# Patient Record
Sex: Female | Born: 1980
Health system: Southern US, Community
[De-identification: ages and names within clinical notes are randomized; demographics above are authoritative.]

## PROBLEM LIST (undated history)

## (undated) DIAGNOSIS — T4145XA Adverse effect of unspecified anesthetic, initial encounter: Secondary | ICD-10-CM

## (undated) DIAGNOSIS — I10 Essential (primary) hypertension: Secondary | ICD-10-CM

## (undated) DIAGNOSIS — I471 Supraventricular tachycardia, unspecified: Secondary | ICD-10-CM

## (undated) DIAGNOSIS — D649 Anemia, unspecified: Secondary | ICD-10-CM

## (undated) DIAGNOSIS — IMO0002 Reserved for concepts with insufficient information to code with codable children: Secondary | ICD-10-CM

## (undated) DIAGNOSIS — G43909 Migraine, unspecified, not intractable, without status migrainosus: Secondary | ICD-10-CM

## (undated) DIAGNOSIS — A749 Chlamydial infection, unspecified: Secondary | ICD-10-CM

## (undated) DIAGNOSIS — T8859XA Other complications of anesthesia, initial encounter: Secondary | ICD-10-CM

## (undated) DIAGNOSIS — R000 Tachycardia, unspecified: Secondary | ICD-10-CM

## (undated) DIAGNOSIS — Z9889 Other specified postprocedural states: Secondary | ICD-10-CM

## (undated) DIAGNOSIS — Z973 Presence of spectacles and contact lenses: Secondary | ICD-10-CM

## (undated) DIAGNOSIS — R55 Syncope and collapse: Secondary | ICD-10-CM

## (undated) DIAGNOSIS — Z8619 Personal history of other infectious and parasitic diseases: Secondary | ICD-10-CM

## (undated) DIAGNOSIS — R87629 Unspecified abnormal cytological findings in specimens from vagina: Secondary | ICD-10-CM

## (undated) DIAGNOSIS — R112 Nausea with vomiting, unspecified: Secondary | ICD-10-CM

## (undated) DIAGNOSIS — Z8742 Personal history of other diseases of the female genital tract: Secondary | ICD-10-CM

## (undated) DIAGNOSIS — D696 Thrombocytopenia, unspecified: Secondary | ICD-10-CM

## (undated) HISTORY — DX: Syncope and collapse: R55

## (undated) HISTORY — PX: HERNIA REPAIR: SHX51

## (undated) HISTORY — DX: Unspecified abnormal cytological findings in specimens from vagina: R87.629

## (undated) HISTORY — PX: COLPOSCOPY: SHX161

## (undated) HISTORY — DX: Thrombocytopenia, unspecified: D69.6

## (undated) HISTORY — DX: Nausea with vomiting, unspecified: Z98.890

## (undated) HISTORY — DX: Nausea with vomiting, unspecified: R11.2

---

## 2005-08-25 HISTORY — PX: DILATION AND CURETTAGE OF UTERUS: SHX78

## 2012-03-09 LAB — OB RESULTS CONSOLE ABO/RH: RH Type: POSITIVE

## 2012-03-09 LAB — OB RESULTS CONSOLE ANTIBODY SCREEN: Antibody Screen: NEGATIVE

## 2012-03-09 LAB — OB RESULTS CONSOLE RPR: RPR: NONREACTIVE

## 2012-03-09 LAB — OB RESULTS CONSOLE HEPATITIS B SURFACE ANTIGEN: Hepatitis B Surface Ag: NEGATIVE

## 2012-08-24 LAB — OB RESULTS CONSOLE GBS: GBS: POSITIVE

## 2012-08-25 NOTE — L&D Delivery Note (Signed)
Delivery Note At 7:55 AM a viable and healthy female was delivered via Vaginal, Spontaneous Delivery (Presentation: Left; Occiput Anterior).  APGAR: , ; weight .   Placenta status: Intact, Spontaneous.  Cord: 3 vessels   Anesthesia: Epidural  Episiotomy: None Lacerations: Bilateral labial lacerations, no repair required Suture Repair: None Est. Blood Loss (mL): 250cc  Mom to postpartum.  Baby to nursery-stable.  Matti Minney H. 09/05/2012, 8:12 AM

## 2012-09-02 ENCOUNTER — Encounter (HOSPITAL_COMMUNITY): Payer: Self-pay | Admitting: *Deleted

## 2012-09-02 ENCOUNTER — Inpatient Hospital Stay (HOSPITAL_COMMUNITY): Payer: BC Managed Care – PPO

## 2012-09-02 ENCOUNTER — Inpatient Hospital Stay (HOSPITAL_COMMUNITY)
Admission: AD | Admit: 2012-09-02 | Discharge: 2012-09-02 | Disposition: A | Discharge status: Home / Self Care | Payer: BC Managed Care – PPO | Source: Ambulatory Visit | Attending: Obstetrics and Gynecology | Admitting: Obstetrics and Gynecology

## 2012-09-02 DIAGNOSIS — O4690 Antepartum hemorrhage, unspecified, unspecified trimester: Secondary | ICD-10-CM | POA: Insufficient documentation

## 2012-09-02 HISTORY — DX: Adverse effect of unspecified anesthetic, initial encounter: T41.45XA

## 2012-09-02 HISTORY — DX: Anemia, unspecified: D64.9

## 2012-09-02 HISTORY — DX: Other complications of anesthesia, initial encounter: T88.59XA

## 2012-09-02 HISTORY — DX: Tachycardia, unspecified: R00.0

## 2012-09-02 NOTE — MAU Provider Note (Signed)
History     CSN: [id number]  Arrival date and time: 09/02/12 0840   None     Chief Complaint  Patient presents with  . Vaginal Bleeding   HPI Amanda Mooney is a 32 y.o. female @ [redacted]w[redacted]d gestation who presents to MAU with vaginal bleeding. The bleeding started this morning. She describes the bleeding as starting out brown and then changing to bright red. She spoke with Dr. Claiborne Billings this morning and planned to go to the office but then began bleeding heavier like a period so came to MAU.  She was examined in the office yesterday and was 2 cm. The history was provided by the patient.  OB History    Grav Para Term Preterm Abortions TAB SAB Ect Mult Living   7 2   2  2   2       Past Medical History  Diagnosis Date  . Anemia   . Tachycardia   . Complication of anesthesia     hard to arrouse after "twilight" anasthsia    Past Surgical History  Procedure Date  . Hernia repair     No family history on file.  History  Substance Use Topics  . Smoking status: Never Smoker   . Smokeless tobacco: Not on file  . Alcohol Use: No    Allergies: No Known Allergies  Prescriptions prior to admission  Medication Sig Dispense Refill  . aspirin-acetaminophen-caffeine (EXCEDRIN MIGRAINE) 250-250-65 MG per tablet Take 1 tablet by mouth daily as needed. For pain      . calcium carbonate (TUMS - DOSED IN MG ELEMENTAL CALCIUM) 500 MG chewable tablet Chew 2 tablets by mouth 2 (two) times daily as needed.      . Prenatal Vit-Fe Fumarate-FA (PRENATAL MULTIVITAMIN) TABS Take 1 tablet by mouth daily.        Review of Systems  Constitutional: Negative for fever and chills.  Eyes: Negative for blurred vision and double vision.  Respiratory: Negative for cough and wheezing.   Cardiovascular: Negative for chest pain.  Gastrointestinal: Negative for nausea, vomiting and abdominal pain.  Genitourinary:       Vaginal bleeding  Musculoskeletal: Negative for back pain.  Neurological: Negative for  dizziness and headaches.  Psychiatric/Behavioral: Negative for depression. The patient is not nervous/anxious.    Physical Exam   Blood pressure 131/88, pulse 118, temperature 98.7 F (37.1 C), temperature source Oral, resp. rate 18.  Physical Exam  Nursing note and vitals reviewed. Constitutional: She is oriented to person, place, and time. She appears well-developed and well-nourished. No distress.  HENT:  Head: Normocephalic and atraumatic.  Eyes: EOM are normal.  Neck: Neck supple.  Cardiovascular:       tachycarida  Respiratory: Effort normal.  GI: Soft. There is no tenderness.  Genitourinary:       External genitalia without lesions. Dark mucous discharge vaginal vault. Cervix 2 cm. dilated  Musculoskeletal: Normal range of motion.  Neurological: She is alert and oriented to person, place, and time.  Skin: Skin is warm and dry.  Psychiatric: She has a normal mood and affect. Her behavior is normal. Judgment and thought content normal.   EFM: Reactive tracing Assessment: 32 y.o. female @ [redacted]w[redacted]d gestation with vaginal bleeding  Plan:  Discussed with Dr. Claiborne Billings   Limited OB ultrasound   RN to call results to Dr. Claiborne Billings and discuss plan of care.   Medical screening exam complete and patient care assumed by Dr. Claiborne Billings Procedures   NEESE,HOPE, RN, FNP,  BC 09/02/2012, 9:32 AM

## 2012-09-02 NOTE — MAU Note (Signed)
Pt reports she had some vaginal bleeding this morning. Got a little heavier like a menstrual cycle. Good fetal movement reported no pain or ctx.

## 2012-09-05 ENCOUNTER — Encounter (HOSPITAL_COMMUNITY): Payer: Self-pay

## 2012-09-05 ENCOUNTER — Inpatient Hospital Stay (HOSPITAL_COMMUNITY): Payer: BC Managed Care – PPO | Admitting: Anesthesiology

## 2012-09-05 ENCOUNTER — Inpatient Hospital Stay (HOSPITAL_COMMUNITY)
Admission: AD | Admit: 2012-09-05 | Discharge: 2012-09-06 | DRG: 373 | Disposition: A | Discharge status: Home / Self Care | Payer: BC Managed Care – PPO | Source: Ambulatory Visit | Attending: Obstetrics and Gynecology | Admitting: Obstetrics and Gynecology

## 2012-09-05 ENCOUNTER — Encounter (HOSPITAL_COMMUNITY): Payer: Self-pay | Admitting: *Deleted

## 2012-09-05 ENCOUNTER — Encounter (HOSPITAL_COMMUNITY): Payer: Self-pay | Admitting: Anesthesiology

## 2012-09-05 DIAGNOSIS — O99892 Other specified diseases and conditions complicating childbirth: Secondary | ICD-10-CM | POA: Diagnosis present

## 2012-09-05 DIAGNOSIS — Z3480 Encounter for supervision of other normal pregnancy, unspecified trimester: Secondary | ICD-10-CM

## 2012-09-05 DIAGNOSIS — Z22330 Carrier of Group B streptococcus: Secondary | ICD-10-CM

## 2012-09-05 HISTORY — DX: Chlamydial infection, unspecified: A74.9

## 2012-09-05 HISTORY — DX: Reserved for concepts with insufficient information to code with codable children: IMO0002

## 2012-09-05 LAB — CBC
HCT: 36.1 % (ref 36.0–46.0)
Platelets: 198 10*3/uL (ref 150–400)
RBC: 3.91 MIL/uL (ref 3.87–5.11)
RDW: 13.6 % (ref 11.5–15.5)
WBC: 12 10*3/uL — ABNORMAL HIGH (ref 4.0–10.5)

## 2012-09-05 MED ORDER — TETANUS-DIPHTH-ACELL PERTUSSIS 5-2.5-18.5 LF-MCG/0.5 IM SUSP
0.5000 mL | Freq: Once | INTRAMUSCULAR | Status: AC
  Administered 2012-09-06: 0.5 mL via INTRAMUSCULAR
  Filled 2012-09-05: qty 0.5

## 2012-09-05 MED ORDER — OXYCODONE-ACETAMINOPHEN 5-325 MG PO TABS
1.0000 | ORAL_TABLET | ORAL | Status: DC | PRN
  Administered 2012-09-05 (×2): 1 via ORAL
  Administered 2012-09-06: 2 via ORAL
  Administered 2012-09-06: 1 via ORAL
  Filled 2012-09-05 (×3): qty 1
  Filled 2012-09-05: qty 2

## 2012-09-05 MED ORDER — LIDOCAINE HCL (PF) 1 % IJ SOLN
INTRAMUSCULAR | Status: DC | PRN
  Administered 2012-09-05 (×2): 4 mL

## 2012-09-05 MED ORDER — LACTATED RINGERS IV SOLN
INTRAVENOUS | Status: DC
  Administered 2012-09-05 (×2): via INTRAVENOUS

## 2012-09-05 MED ORDER — LACTATED RINGERS IV SOLN: 500.0000 mL | INTRAVENOUS | Status: DC | PRN

## 2012-09-05 MED ORDER — DIPHENHYDRAMINE HCL 25 MG PO CAPS: 25.0000 mg | ORAL_CAPSULE | Freq: Four times a day (QID) | ORAL | Status: DC | PRN

## 2012-09-05 MED ORDER — IBUPROFEN 600 MG PO TABS
600.0000 mg | ORAL_TABLET | Freq: Four times a day (QID) | ORAL | Status: DC
  Administered 2012-09-05 – 2012-09-06 (×5): 600 mg via ORAL
  Filled 2012-09-05 (×5): qty 1

## 2012-09-05 MED ORDER — IBUPROFEN 600 MG PO TABS: 600.0000 mg | ORAL_TABLET | Freq: Four times a day (QID) | ORAL | Status: DC | PRN

## 2012-09-05 MED ORDER — PHENYLEPHRINE 40 MCG/ML (10ML) SYRINGE FOR IV PUSH (FOR BLOOD PRESSURE SUPPORT)
80.0000 ug | PREFILLED_SYRINGE | INTRAVENOUS | Status: DC | PRN
  Filled 2012-09-05: qty 5

## 2012-09-05 MED ORDER — METHYLERGONOVINE MALEATE 0.2 MG PO TABS: 0.2000 mg | ORAL_TABLET | ORAL | Status: DC | PRN

## 2012-09-05 MED ORDER — DEXTROSE 5 % IV SOLN
5.0000 10*6.[IU] | Freq: Once | INTRAVENOUS | Status: AC
  Administered 2012-09-05: 5 10*6.[IU] via INTRAVENOUS
  Filled 2012-09-05: qty 5

## 2012-09-05 MED ORDER — FENTANYL 2.5 MCG/ML BUPIVACAINE 1/10 % EPIDURAL INFUSION (WH - ANES)
14.0000 mL/h | INTRAMUSCULAR | Status: DC
  Filled 2012-09-05: qty 125

## 2012-09-05 MED ORDER — METHYLERGONOVINE MALEATE 0.2 MG/ML IJ SOLN: 0.2000 mg | INTRAMUSCULAR | Status: DC | PRN

## 2012-09-05 MED ORDER — PENICILLIN G POTASSIUM 5000000 UNITS IJ SOLR
2.5000 10*6.[IU] | INTRAVENOUS | Status: DC
  Filled 2012-09-05 (×3): qty 2.5

## 2012-09-05 MED ORDER — ONDANSETRON HCL 4 MG/2ML IJ SOLN: 4.0000 mg | Freq: Four times a day (QID) | INTRAMUSCULAR | Status: DC | PRN

## 2012-09-05 MED ORDER — LACTATED RINGERS IV SOLN
500.0000 mL | Freq: Once | INTRAVENOUS | Status: AC
  Administered 2012-09-05: 500 mL via INTRAVENOUS

## 2012-09-05 MED ORDER — ACETAMINOPHEN 325 MG PO TABS: 650.0000 mg | ORAL_TABLET | ORAL | Status: DC | PRN

## 2012-09-05 MED ORDER — CITRIC ACID-SODIUM CITRATE 334-500 MG/5ML PO SOLN: 30.0000 mL | ORAL | Status: DC | PRN

## 2012-09-05 MED ORDER — SIMETHICONE 80 MG PO CHEW: 80.0000 mg | CHEWABLE_TABLET | ORAL | Status: DC | PRN

## 2012-09-05 MED ORDER — DIBUCAINE 1 % RE OINT: 1.0000 "application " | TOPICAL_OINTMENT | RECTAL | Status: DC | PRN

## 2012-09-05 MED ORDER — SENNOSIDES-DOCUSATE SODIUM 8.6-50 MG PO TABS
2.0000 | ORAL_TABLET | Freq: Every day | ORAL | Status: DC
  Administered 2012-09-05: 2 via ORAL

## 2012-09-05 MED ORDER — INFLUENZA VIRUS VACC SPLIT PF IM SUSP
0.5000 mL | INTRAMUSCULAR | Status: AC
  Administered 2012-09-06: 0.5 mL via INTRAMUSCULAR
  Filled 2012-09-05: qty 0.5

## 2012-09-05 MED ORDER — DIPHENHYDRAMINE HCL 50 MG/ML IJ SOLN: 12.5000 mg | INTRAMUSCULAR | Status: DC | PRN

## 2012-09-05 MED ORDER — PRENATAL MULTIVITAMIN CH
1.0000 | ORAL_TABLET | Freq: Every day | ORAL | Status: DC
  Administered 2012-09-05 – 2012-09-06 (×2): 1 via ORAL
  Filled 2012-09-05 (×2): qty 1

## 2012-09-05 MED ORDER — ONDANSETRON HCL 4 MG PO TABS: 4.0000 mg | ORAL_TABLET | ORAL | Status: DC | PRN

## 2012-09-05 MED ORDER — LIDOCAINE HCL (PF) 1 % IJ SOLN
30.0000 mL | INTRAMUSCULAR | Status: DC | PRN
  Filled 2012-09-05: qty 30

## 2012-09-05 MED ORDER — OXYTOCIN 40 UNITS IN LACTATED RINGERS INFUSION - SIMPLE MED
62.5000 mL/h | INTRAVENOUS | Status: DC
  Administered 2012-09-05: 62.5 mL/h via INTRAVENOUS
  Filled 2012-09-05: qty 1000

## 2012-09-05 MED ORDER — WITCH HAZEL-GLYCERIN EX PADS: 1.0000 "application " | MEDICATED_PAD | CUTANEOUS | Status: DC | PRN

## 2012-09-05 MED ORDER — BENZOCAINE-MENTHOL 20-0.5 % EX AERO: 1.0000 "application " | INHALATION_SPRAY | CUTANEOUS | Status: DC | PRN

## 2012-09-05 MED ORDER — ZOLPIDEM TARTRATE 5 MG PO TABS: 5.0000 mg | ORAL_TABLET | Freq: Every evening | ORAL | Status: DC | PRN

## 2012-09-05 MED ORDER — LANOLIN HYDROUS EX OINT: TOPICAL_OINTMENT | CUTANEOUS | Status: DC | PRN

## 2012-09-05 MED ORDER — ONDANSETRON HCL 4 MG/2ML IJ SOLN: 4.0000 mg | INTRAMUSCULAR | Status: DC | PRN

## 2012-09-05 MED ORDER — OXYCODONE-ACETAMINOPHEN 5-325 MG PO TABS: 1.0000 | ORAL_TABLET | ORAL | Status: DC | PRN

## 2012-09-05 MED ORDER — EPHEDRINE 5 MG/ML INJ: 10.0000 mg | INTRAVENOUS | Status: DC | PRN

## 2012-09-05 MED ORDER — FENTANYL 2.5 MCG/ML BUPIVACAINE 1/10 % EPIDURAL INFUSION (WH - ANES)
INTRAMUSCULAR | Status: DC | PRN
  Administered 2012-09-05: 12 mL/h via EPIDURAL

## 2012-09-05 MED ORDER — EPHEDRINE 5 MG/ML INJ
10.0000 mg | INTRAVENOUS | Status: DC | PRN
  Filled 2012-09-05: qty 4

## 2012-09-05 MED ORDER — OXYTOCIN BOLUS FROM INFUSION: 500.0000 mL | INTRAVENOUS | Status: DC

## 2012-09-05 MED ORDER — PHENYLEPHRINE 40 MCG/ML (10ML) SYRINGE FOR IV PUSH (FOR BLOOD PRESSURE SUPPORT): 80.0000 ug | PREFILLED_SYRINGE | INTRAVENOUS | Status: DC | PRN

## 2012-09-05 MED ORDER — FLEET ENEMA 7-19 GM/118ML RE ENEM: 1.0000 | ENEMA | RECTAL | Status: DC | PRN

## 2012-09-05 NOTE — Progress Notes (Signed)
To BS via w/c °

## 2012-09-05 NOTE — Anesthesia Postprocedure Evaluation (Signed)
  Anesthesia Post-op Note  Patient: Amanda Mooney  Procedure(s) Performed: * No procedures listed *  Patient Location: Mother/Baby  Anesthesia Type:Epidural  Level of Consciousness: awake, alert  and oriented  Airway and Oxygen Therapy: Patient Spontanous Breathing  Post-op Pain: mild  Post-op Assessment: Patient's Cardiovascular Status Stable, Respiratory Function Stable, No headache, No backache, No residual numbness and No residual motor weakness  Post-op Vital Signs: stable  Complications: No apparent anesthesia complications

## 2012-09-05 NOTE — MAU Note (Signed)
Contractions all day have gotten regular the last couple of hours unable to sleep, denies vaginal bleeding or LOF

## 2012-09-05 NOTE — Anesthesia Procedure Notes (Signed)
Epidural Patient location during procedure: OB Start time: 09/05/2012 5:11 AM  Staffing Anesthesiologist: Tykera Skates A. Performed by: anesthesiologist   Preanesthetic Checklist Completed: patient identified, site marked, surgical consent, pre-op evaluation, timeout performed, IV checked, risks and benefits discussed and monitors and equipment checked  Epidural Patient position: sitting Prep: site prepped and draped and DuraPrep Patient monitoring: continuous pulse ox and blood pressure Approach: midline Injection technique: LOR air  Needle:  Needle type: Tuohy  Needle gauge: 17 G Needle length: 9 cm and 9 Needle insertion depth: 5 cm cm Catheter type: closed end flexible Catheter size: 19 Gauge Catheter at skin depth: 10 cm Test dose: negative and Other  Assessment Events: blood not aspirated, injection not painful, no injection resistance, negative IV test and no paresthesia  Additional Notes Patient identified. Risks and benefits discussed including failed block, incomplete  Pain control, post dural puncture headache, nerve damage, paralysis, blood pressure Changes, nausea, vomiting, reactions to medications-both toxic and allergic and post Partum back pain. All questions were answered. Patient expressed understanding and wished to proceed. Sterile technique was used throughout procedure. Epidural site was Dressed with sterile barrier dressing. No paresthesias, signs of intravascular injection Or signs of intrathecal spread were encountered.  Patient was more comfortable after the epidural was dosed. Please see RN's note for documentation of vital signs and FHR which are stable.

## 2012-09-05 NOTE — H&P (Signed)
Amanda Mooney is a 32 y.o. female presenting for labor  31 yo G3P2002 @ 38+0 p/w c/o contractions and was found to be change her cervix in MAU and was admitted for labor.  Her pregnancy has been uncomplicated to this point.  History OB History    Grav Para Term Preterm Abortions TAB SAB Ect Mult Living   3 2   0  0   2     Past Medical History  Diagnosis Date  . Anemia   . Tachycardia   . Complication of anesthesia     hard to arrouse after "twilight" anasthsia  . Chlamydia   . Abnormal Pap smear    Past Surgical History  Procedure Date  . Hernia repair   . Colposcopy    Family History: family history is not on file. Social History:  reports that she has never smoked. She does not have any smokeless tobacco history on file. She reports that she does not drink alcohol or use illicit drugs.   Prenatal Transfer Tool  Maternal Diabetes: No Genetic Screening: Normal Maternal Ultrasounds/Referrals: Normal Fetal Ultrasounds or other Referrals:  None Maternal Substance Abuse:  No Significant Maternal Medications:  None Significant Maternal Lab Results:  None Other Comments:  None  ROS: As above  Dilation: 5 Effacement (%): 100 Station: -2 Exam by:: Dr. Tenny Craw Blood pressure 128/64, pulse 88, temperature 98.4 F (36.9 C), temperature source Oral, resp. rate 16, height 5\' 1"  (1.549 m), weight 76.204 kg (168 lb), SpO2 100.00%. Exam Physical Exam  Prenatal labs: ABO, Rh: O/Positive/-- (07/16 0000) Antibody: Negative (07/16 0000) Rubella: Immune (07/16 0000) RPR: Nonreactive (07/16 0000)  HBsAg: Negative (07/16 0000)  HIV: Non-reactive (07/16 0000)  GBS: Positive (12/31 0000)   Assessment/Plan: 1) Admit 2) Epidural 3) PCN for GBS 4) AROM for clear fluid after adequate PCN prophylaxis  Devontaye Ground H. 09/05/2012, 7:13 AM

## 2012-09-05 NOTE — MAU Note (Signed)
I've been having menstrual like pains for couple hrs every 2-57mins. No bleeding or leaking. Lost mucous plug earlier today.

## 2012-09-05 NOTE — Progress Notes (Signed)
Pt on side and ctxs not recording well. Pt states ctxs are stronger.

## 2012-09-05 NOTE — Progress Notes (Signed)
Report called to Dana RN in BS.  

## 2012-09-05 NOTE — Anesthesia Preprocedure Evaluation (Signed)
Anesthesia Evaluation  Patient identified by MRN, date of birth, ID band Patient awake    Reviewed: Allergy & Precautions, H&P , Patient's Chart, lab work & pertinent test results  History of Anesthesia Complications (+) PROLONGED EMERGENCE  Airway Mallampati: III TM Distance: >3 FB Neck ROM: Full    Dental No notable dental hx. (+) Teeth Intact   Pulmonary neg pulmonary ROS,  breath sounds clear to auscultation- rhonchi  Pulmonary exam normal       Cardiovascular negative cardio ROS  Rhythm:Regular Rate:Normal  Tachycardia   Neuro/Psych negative neurological ROS  negative psych ROS   GI/Hepatic Neg liver ROS, GERD-  Medicated and Controlled,  Endo/Other  negative endocrine ROS  Renal/GU negative Renal ROS  negative genitourinary   Musculoskeletal negative musculoskeletal ROS (+)   Abdominal   Peds  Hematology negative hematology ROS (+)   Anesthesia Other Findings   Reproductive/Obstetrics (+) Pregnancy                           Anesthesia Physical Anesthesia Plan  ASA: II  Anesthesia Plan: Epidural   Post-op Pain Management:    Induction:   Airway Management Planned: Natural Airway  Additional Equipment:   Intra-op Plan:   Post-operative Plan:   Informed Consent: I have reviewed the patients History and Physical, chart, labs and discussed the procedure including the risks, benefits and alternatives for the proposed anesthesia with the patient or authorized representative who has indicated his/her understanding and acceptance.   Dental advisory given  Plan Discussed with: Anesthesiologist  Anesthesia Plan Comments:         Anesthesia Quick Evaluation

## 2012-09-05 NOTE — Progress Notes (Signed)
Report called to Dr Tenny Craw. Aware of sve, ctx pattern, reactive FHR. Will admit to [facility]

## 2012-09-06 LAB — CBC
HCT: 32.6 % — ABNORMAL LOW (ref 36.0–46.0)
MCV: 93.4 fL (ref 78.0–100.0)
RBC: 3.49 MIL/uL — ABNORMAL LOW (ref 3.87–5.11)
RDW: 13.7 % (ref 11.5–15.5)
WBC: 10 10*3/uL (ref 4.0–10.5)

## 2012-09-06 MED ORDER — OXYCODONE-ACETAMINOPHEN 5-325 MG PO TABS: 1.0000 | ORAL_TABLET | ORAL | Status: DC | PRN

## 2012-09-06 NOTE — Discharge Summary (Signed)
Obstetric Discharge Summary Reason for Admission: onset of labor Prenatal Procedures: ultrasound Intrapartum Procedures: spontaneous vaginal delivery Postpartum Procedures: none Complications-Operative and Postpartum: bilateral labial tears Hemoglobin  Date Value Range Status  09/06/2012 10.9* 12.0 - 15.0 g/dL Final     HCT  Date Value Range Status  09/06/2012 32.6* 36.0 - 46.0 % Final    Physical Exam:  General: alert Lochia: appropriate Uterine Fundus: firm  Discharge Diagnoses: Term Pregnancy-delivered  Discharge Information: Date: 09/06/2012 Activity: pelvic rest Diet: routine Medications: PNV, Ibuprofen and Percocet Condition: stable Instructions: refer to practice specific booklet Discharge to: home Follow-up Information    Follow up with Almon Hercules., MD. Schedule an appointment as soon as possible for a visit in 4 weeks.   Contact information:   [address] [place] [address] [phone]          Newborn Data: Live born female  Birth Weight: 5 lb 15.4 oz (2705 g) APGAR: 9, 9  Home with mother.  ANDERSON,MARK E 09/06/2012, 8:12 AM
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
## 2012-09-19 ENCOUNTER — Inpatient Hospital Stay (HOSPITAL_COMMUNITY): Admission: AD | Admit: 2012-09-19 | Payer: Self-pay | Source: Ambulatory Visit | Admitting: Obstetrics & Gynecology

## 2013-12-13 ENCOUNTER — Emergency Department (HOSPITAL_COMMUNITY)
Admission: EM | Admit: 2013-12-13 | Discharge: 2013-12-13 | Disposition: A | Discharge status: Home / Self Care | Payer: No Typology Code available for payment source | Attending: Emergency Medicine | Admitting: Emergency Medicine

## 2013-12-13 ENCOUNTER — Encounter (HOSPITAL_COMMUNITY): Payer: Self-pay | Admitting: Emergency Medicine

## 2013-12-13 ENCOUNTER — Emergency Department (HOSPITAL_COMMUNITY): Payer: No Typology Code available for payment source

## 2013-12-13 ENCOUNTER — Emergency Department (HOSPITAL_COMMUNITY): Admission: EM | Admit: 2013-12-13 | Discharge: 2013-12-13 | Discharge status: Against Medical Advice | Payer: Self-pay

## 2013-12-13 DIAGNOSIS — Z3202 Encounter for pregnancy test, result negative: Secondary | ICD-10-CM | POA: Insufficient documentation

## 2013-12-13 DIAGNOSIS — Y92410 Unspecified street and highway as the place of occurrence of the external cause: Secondary | ICD-10-CM | POA: Insufficient documentation

## 2013-12-13 DIAGNOSIS — R42 Dizziness and giddiness: Secondary | ICD-10-CM | POA: Insufficient documentation

## 2013-12-13 DIAGNOSIS — G43909 Migraine, unspecified, not intractable, without status migrainosus: Secondary | ICD-10-CM | POA: Insufficient documentation

## 2013-12-13 DIAGNOSIS — Y9389 Activity, other specified: Secondary | ICD-10-CM | POA: Insufficient documentation

## 2013-12-13 DIAGNOSIS — H5500 Unspecified nystagmus: Secondary | ICD-10-CM | POA: Insufficient documentation

## 2013-12-13 DIAGNOSIS — S0990XA Unspecified injury of head, initial encounter: Secondary | ICD-10-CM | POA: Insufficient documentation

## 2013-12-13 DIAGNOSIS — S161XXA Strain of muscle, fascia and tendon at neck level, initial encounter: Secondary | ICD-10-CM

## 2013-12-13 DIAGNOSIS — Z8619 Personal history of other infectious and parasitic diseases: Secondary | ICD-10-CM | POA: Insufficient documentation

## 2013-12-13 DIAGNOSIS — S139XXA Sprain of joints and ligaments of unspecified parts of neck, initial encounter: Secondary | ICD-10-CM | POA: Insufficient documentation

## 2013-12-13 DIAGNOSIS — R519 Headache, unspecified: Secondary | ICD-10-CM

## 2013-12-13 DIAGNOSIS — Z862 Personal history of diseases of the blood and blood-forming organs and certain disorders involving the immune mechanism: Secondary | ICD-10-CM | POA: Insufficient documentation

## 2013-12-13 DIAGNOSIS — R51 Headache: Secondary | ICD-10-CM

## 2013-12-13 HISTORY — DX: Migraine, unspecified, not intractable, without status migrainosus: G43.909

## 2013-12-13 LAB — POC URINE PREG, ED: Preg Test, Ur: NEGATIVE

## 2013-12-13 MED ORDER — CYCLOBENZAPRINE HCL 10 MG PO TABS: 10.0000 mg | ORAL_TABLET | Freq: Two times a day (BID) | ORAL | Status: DC | PRN

## 2013-12-13 MED ORDER — TRAMADOL HCL 50 MG PO TABS: 50.0000 mg | ORAL_TABLET | Freq: Four times a day (QID) | ORAL | Status: DC | PRN

## 2013-12-13 MED ORDER — KETOROLAC TROMETHAMINE 60 MG/2ML IM SOLN: 60.0000 mg | Freq: Once | INTRAMUSCULAR | Status: DC

## 2013-12-13 MED ORDER — KETOROLAC TROMETHAMINE 60 MG/2ML IM SOLN
60.0000 mg | Freq: Once | INTRAMUSCULAR | Status: DC
  Filled 2013-12-13: qty 2

## 2013-12-13 MED ORDER — TRAMADOL HCL 50 MG PO TABS
50.0000 mg | ORAL_TABLET | Freq: Once | ORAL | Status: AC
  Administered 2013-12-13: 50 mg via ORAL
  Filled 2013-12-13: qty 1
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
## 2013-12-27 ENCOUNTER — Encounter (HOSPITAL_COMMUNITY): Payer: Self-pay | Admitting: Emergency Medicine

## 2014-06-26 ENCOUNTER — Encounter (HOSPITAL_COMMUNITY): Payer: Self-pay | Admitting: Emergency Medicine

## 2016-06-19 ENCOUNTER — Encounter (HOSPITAL_COMMUNITY): Payer: Self-pay | Admitting: *Deleted

## 2016-06-19 ENCOUNTER — Emergency Department (HOSPITAL_COMMUNITY): Payer: BLUE CROSS/BLUE SHIELD

## 2016-06-19 ENCOUNTER — Emergency Department (HOSPITAL_COMMUNITY)
Admission: EM | Admit: 2016-06-19 | Discharge: 2016-06-20 | Disposition: A | Discharge status: Home / Self Care | Payer: BLUE CROSS/BLUE SHIELD | Attending: Emergency Medicine | Admitting: Emergency Medicine

## 2016-06-19 DIAGNOSIS — R002 Palpitations: Secondary | ICD-10-CM | POA: Diagnosis not present

## 2016-06-19 DIAGNOSIS — R079 Chest pain, unspecified: Secondary | ICD-10-CM | POA: Diagnosis not present

## 2016-06-19 DIAGNOSIS — Z7982 Long term (current) use of aspirin: Secondary | ICD-10-CM | POA: Diagnosis not present

## 2016-06-19 DIAGNOSIS — R0602 Shortness of breath: Secondary | ICD-10-CM

## 2016-06-19 DIAGNOSIS — R0789 Other chest pain: Secondary | ICD-10-CM | POA: Insufficient documentation

## 2016-06-19 LAB — BASIC METABOLIC PANEL
ANION GAP: 6 (ref 5–15)
BUN: 7 mg/dL (ref 6–20)
CALCIUM: 8.8 mg/dL — AB (ref 8.9–10.3)
CO2: 26 mmol/L (ref 22–32)
CREATININE: 0.73 mg/dL (ref 0.44–1.00)
Chloride: 105 mmol/L (ref 101–111)
GFR calc non Af Amer: >60 mL/min (ref >60)
Glucose, Bld: 113 mg/dL — ABNORMAL HIGH (ref 65–99)
Potassium: 3.8 mmol/L (ref 3.5–5.1)
SODIUM: 137 mmol/L (ref 135–145)

## 2016-06-19 LAB — CBC
HCT: 37.2 % (ref 36.0–46.0)
HEMOGLOBIN: 12 g/dL (ref 12.0–15.0)
MCH: 29.1 pg (ref 26.0–34.0)
MCHC: 32.3 g/dL (ref 30.0–36.0)
MCV: 90.1 fL (ref 78.0–100.0)
PLATELETS: 342 10*3/uL (ref 150–400)
RBC: 4.13 MIL/uL (ref 3.87–5.11)
RDW: 13.1 % (ref 11.5–15.5)
WBC: 10.2 10*3/uL (ref 4.0–10.5)

## 2016-06-19 LAB — POC URINE PREG, ED: PREG TEST UR: NEGATIVE

## 2016-06-19 LAB — I-STAT TROPONIN, ED: TROPONIN I, POC: 0 ng/mL (ref 0.00–0.08)

## 2016-06-19 NOTE — ED Provider Notes (Signed)
[facility] DEPT Provider Note   CSN: [id number] Arrival date & time: 06/19/16  2042     History   Chief Complaint Chief Complaint  Patient presents with  . Chest Pain    HPI Amanda Mooney is a 35 y.o. female.  35 year old female with past medical history including tachycardia, anemia who presents with chest discomfort and shortness of breath. The patient endorses a long-standing history of intermittent problems with heart palpitations, tachycardia, chest discomfort and shortness of breath, running at least 5 years ago. In the past she has been on metoprolol for the symptoms but each time she has become pregnant she has gone off of the medication. She does well for a while and then eventually has the symptoms again and goes back on the medication. The last time she experienced these symptoms was approximately 2 years ago. She has seen a cardiologist in the past and had discussions about a possible ablation but never ended up having any procedures. She stopped taking the medication after her third child and has only had occasional mild symptoms until the past few days. She reports that several days ago she had tachycardia and chest discomfort as well as shortness of breath with exertion, similar to previous episodes but this time it did not resolve with muscle relaxant or lorazepam, which usually helps with the symptoms. She eventually took a Nexium because of concurrent epigastric discomfort and states that her symptoms improved after that. Yesterday she did not have any significant symptoms but her symptoms began again today. She reports feeling stressed during the episodes. She states that the symptoms are exactly like previous symptoms that required metoprolol use. She denies any fever, cough/cold symptoms, new medications, or recent illness. No nausea or vomiting but she has had epigastric discomfort this week.   The history is provided by the patient.  Chest Pain      Past Medical  History:  Diagnosis Date  . Abnormal Pap smear   . Anemia   . Chlamydia   . Complication of anesthesia    hard to arrouse after "twilight" anasthsia  . Migraine   . Tachycardia     There are no active problems to display for this patient.   Past Surgical History:  Procedure Laterality Date  . COLPOSCOPY    . HERNIA REPAIR      OB History    Gravida Para Term Preterm AB Living   3 3 1    0 3   SAB TAB Ectopic Multiple Live Births   0       1       Home Medications    Prior to Admission medications   Medication Sig Start Date End Date Taking? Authorizing Provider  aspirin-acetaminophen-caffeine (EXCEDRIN MIGRAINE) 4802362144 MG per tablet Take 1 tablet by mouth every 6 (six) hours as needed for headache.   Yes Historical Provider, MD  cyclobenzaprine (FLEXERIL) 10 MG tablet Take 1 tablet (10 mg total) by mouth 2 (two) times daily as needed for muscle spasms. 12/13/13  Yes Malvin Johns, MD  esomeprazole (NEXIUM) 40 MG capsule Take 40 mg by mouth daily as needed (acid reflux).   Yes Historical Provider, MD  tiZANidine (ZANAFLEX) 4 MG tablet Take 4 mg by mouth every 6 (six) hours as needed for muscle spasms.   Yes Historical Provider, MD  traMADol (ULTRAM) 50 MG tablet Take 1 tablet (50 mg total) by mouth every 6 (six) hours as needed. Patient taking differently: Take 50 mg by mouth every  6 (six) hours as needed for moderate pain.  12/13/13  Yes Malvin Johns, MD  metoprolol (LOPRESSOR) 25 MG tablet Take 1 tablet (25 mg total) by mouth 2 (two) times daily. 06/20/16   Sharlett Iles, MD    Family History No family history on file.  Social History Social History  Substance Use Topics  . Smoking status: Never Smoker  . Smokeless tobacco: Never Used  . Alcohol use No     Allergies   Review of patient's allergies indicates no known allergies.   Review of Systems Review of Systems  Cardiovascular: Positive for chest pain.   10 Systems reviewed and are negative  for acute change except as noted in the HPI.   Physical Exam Updated Vital Signs BP 97/69   Pulse 86   Temp 98.2 F (36.8 C) (Oral)   Resp 14   Ht 5\' 1"  (1.549 m)   Wt 136 lb (61.7 kg)   LMP 06/05/2016   SpO2 99%   BMI 25.70 kg/m   Physical Exam  Constitutional: She is oriented to person, place, and time. She appears well-developed and well-nourished. No distress.  HENT:  Head: Normocephalic and atraumatic.  Moist mucous membranes  Eyes: Conjunctivae are normal. Pupils are equal, round, and reactive to light.  Neck: Neck supple.  Cardiovascular: Normal rate, regular rhythm and normal heart sounds.   No murmur heard. Pulmonary/Chest: Effort normal and breath sounds normal.  Abdominal: Soft. Bowel sounds are normal. She exhibits no distension. There is no tenderness.  Musculoskeletal: She exhibits no edema.  Neurological: She is alert and oriented to person, place, and time.  Fluent speech  Skin: Skin is warm and dry.  Psychiatric: She has a normal mood and affect. Judgment normal.  Nursing note and vitals reviewed.    ED Treatments / Results  Labs (all labs ordered are listed, but only abnormal results are displayed) Labs Reviewed  BASIC METABOLIC PANEL - Abnormal; Notable for the following:       Result Value   Glucose, Bld 113 (*)    Calcium 8.8 (*)    All other components within normal limits  CBC  I-STAT TROPOININ, ED  POC URINE PREG, ED    EKG  EKG Interpretation  Date/Time:  Thursday June 19 2016 20:46:00 EDT Ventricular Rate:  108 PR Interval:  116 QRS Duration: 84 QT Interval:  328 QTC Calculation: 439 R Axis:   78 Text Interpretation:  Sinus tachycardia Otherwise normal ECG No previous ECGs available Confirmed by LITTLE MD, RACHEL [id number]) on 06/19/2016 9:07:38 PM       Radiology Dg Chest 2 View  Result Date: 06/19/2016 CLINICAL DATA:  Chest pain and comfort. EXAM: CHEST  2 VIEW COMPARISON:  None. FINDINGS: The heart size and mediastinal  contours are within normal limits. Both lungs are clear. The visualized skeletal structures are unremarkable. IMPRESSION: No active cardiopulmonary disease. Electronically Signed   By: Abelardo Diesel M.D.   On: 06/19/2016 21:29    Procedures Procedures (including critical care time)  Medications Ordered in ED Medications - No data to display   Initial Impression / Assessment and Plan / ED Course  I have reviewed the triage vital signs and the nursing notes.  Pertinent labs & imaging results that were available during my care of the patient were reviewed by me and considered in my medical decision making (see chart for details).  Clinical Course   Patient with intermittent symptoms of heart palpitations, shortness of breath, and chest  discomfort which she has had many times previously, was on metoprolol in the past and has seen cardiologist previously but does not currently follow in the clinic. She was well-appearing on exam with reassuring vital signs. Occasional sinus tachycardia but rate in the 80s to 90s for most of the time in the ED. Her lab work here is reassuring and EKG shows no evidence of arrhythmia. No risk factors for PE or early heart disease. She states that all these symptoms are similar to her previous episodes that usually responded well to metoprolol. I discussed patient with cardiology, Dr. Harrington Challenger, appreciate her assistance. She will make arrangements to have patient follow-up in the clinic and agreed with plan to discharge patient with metoprolol. Cautioned patient on use and instructed to start with 12.5 mg twice a day, titrate up to 25 mg if needed. Return precautions reviewed and patient discharged in satisfactory condition.  Final Clinical Impressions(s) / ED Diagnoses   Final diagnoses:  Heart palpitations  Shortness of breath  Chest tightness or pressure    New Prescriptions New Prescriptions   METOPROLOL (LOPRESSOR) 25 MG TABLET    Take 1 tablet (25 mg total) by  mouth 2 (two) times daily.     Sharlett Iles, MD 06/20/16 [phone]

## 2016-06-19 NOTE — ED Triage Notes (Signed)
Pt is here for chest discomfort that began today around 3pm, pt has also had palpitations today.  Pt has hx of tachycardia (unknown type) for which she used to take metoprolol. Pt states that the chest discomfort increases when she stands up and ambulates.  Pt also has sob with this.  Pt reports that for the past week she has had some epigastric discomfort.  Pt is alert and oriented, no palpitations at this time.  Pt reports that she had 5 episodes of palpitations today with max HR 140.  No palpitations at this time.

## 2016-06-20 MED ORDER — METOPROLOL TARTRATE 25 MG PO TABS: 25.0000 mg | ORAL_TABLET | Freq: Two times a day (BID) | ORAL | 0 refills | Status: DC

## 2016-06-20 NOTE — ED Notes (Signed)
Patient Alert and oriented X4. Stable and ambulatory. Patient verbalized understanding of the discharge instructions.  Patient belongings were taken by the patient.  

## 2016-06-20 NOTE — Discharge Instructions (Signed)
You may start taking 12.5mg  (1/2 tablet) of metoprolol twice daily and increase to 1 tablet twice daily as needed. Do not take the medicine if you feel lightheaded.

## 2016-07-08 ENCOUNTER — Ambulatory Visit
Admission: RE | Admit: 2016-07-08 | Discharge: 2016-07-08 | Disposition: A | Discharge status: Home / Self Care | Payer: BLUE CROSS/BLUE SHIELD | Source: Ambulatory Visit | Attending: Family Medicine | Admitting: Family Medicine

## 2016-07-08 ENCOUNTER — Other Ambulatory Visit: Payer: Self-pay | Admitting: Family Medicine

## 2016-07-08 ENCOUNTER — Encounter: Payer: Self-pay | Admitting: Cardiovascular Disease

## 2016-07-08 DIAGNOSIS — R0602 Shortness of breath: Secondary | ICD-10-CM

## 2016-07-08 DIAGNOSIS — R079 Chest pain, unspecified: Secondary | ICD-10-CM | POA: Diagnosis not present

## 2016-07-08 DIAGNOSIS — R05 Cough: Secondary | ICD-10-CM | POA: Diagnosis not present

## 2016-07-08 DIAGNOSIS — R000 Tachycardia, unspecified: Secondary | ICD-10-CM | POA: Diagnosis not present

## 2016-07-08 MED ORDER — IOPAMIDOL (ISOVUE-370) INJECTION 76%
75.0000 mL | Freq: Once | INTRAVENOUS | Status: AC | PRN
  Administered 2016-07-08: 75 mL via INTRAVENOUS

## 2016-07-09 ENCOUNTER — Ambulatory Visit (INDEPENDENT_AMBULATORY_CARE_PROVIDER_SITE_OTHER): Payer: BLUE CROSS/BLUE SHIELD | Admitting: Cardiovascular Disease

## 2016-07-09 ENCOUNTER — Encounter: Payer: Self-pay | Admitting: Cardiovascular Disease

## 2016-07-09 VITALS — BP 102/70 | HR 97 | Ht 61.0 in | Wt 137.6 lb

## 2016-07-09 DIAGNOSIS — I471 Supraventricular tachycardia: Secondary | ICD-10-CM

## 2016-07-09 LAB — TSH: TSH: 1.02 m[IU]/L

## 2016-07-09 MED ORDER — METOPROLOL TARTRATE 25 MG PO TABS: 25.0000 mg | ORAL_TABLET | Freq: Two times a day (BID) | ORAL | 11 refills | Status: DC

## 2016-07-09 MED ORDER — PROPRANOLOL HCL 10 MG PO TABS: 10.0000 mg | ORAL_TABLET | Freq: Four times a day (QID) | ORAL | 6 refills | Status: DC | PRN

## 2016-07-09 NOTE — Patient Instructions (Signed)
Medication Instructions:  TAKE Propranolol 10 mg up to 4 times per day as needed for fast heart rate/palpitatios   Labwork: TODAY - TSH   Testing/Procedures: Your physician has recommended that you wear an event monitor. Event monitors are medical devices that record the heart's electrical activity. Doctors most often [place] these monitors to diagnose arrhythmias. Arrhythmias are problems with the speed or rhythm of the heartbeat. The monitor is a small, portable device. You can wear one while you do your normal daily activities. This is usually used to diagnose what is causing palpitations/syncope (passing out).    Follow-Up: Your physician recommends that you schedule a follow-up appointment in: 3 months with Dr. Acie Fredrickson   If you need a refill on your cardiac medications before your next appointment, please call your pharmacy.   Thank you for choosing [facility]! Christen Bame, RN [phone]

## 2016-07-09 NOTE — Progress Notes (Signed)
Cardiology Office Note   Date:  07/09/2016   ID:  Amanda, Mooney 10-27-1980, MRN [id number]  PCP:  London Pepper, MD  Cardiologist:   Mertie Moores, MD   Chief Complaint  Patient presents with  . Palpitations      History of Present Illness: Amanda Mooney is a 35 y.o. female who presents for palpitations  Has had palpitations. Takes metoprolol Off and on  Has had epigastric and chest pain .  Resolved with Nexium  The next day had more chest / epigastric pain  HR was 140 . Went to the ER Takes metoprolol as needed.   Still has DOE,  Has developed a cough.   Now on Z-pack  Went to primary MD yesterday Ordered CT angio of chest  - negative for PE .   Has seen [facility] in the past   Does not get regular exercise.   Is very active with work and activities.   Has 3 children ( 15 5,3)   Past Medical History:  Diagnosis Date  . Abnormal Pap smear   . Anemia   . Chlamydia   . Complication of anesthesia    hard to arrouse after "twilight" anasthsia  . Migraine   . Tachycardia     Past Surgical History:  Procedure Laterality Date  . COLPOSCOPY    . HERNIA REPAIR       Current Outpatient Prescriptions  Medication Sig Dispense Refill  . aspirin-acetaminophen-caffeine (EXCEDRIN MIGRAINE) 250-250-65 MG per tablet Take 1 tablet by mouth every 6 (six) hours as needed for headache.    Marland Kitchen azithromycin (ZITHROMAX) 250 MG tablet Take 250 mg by mouth daily.    . cyclobenzaprine (FLEXERIL) 10 MG tablet Take 1 tablet (10 mg total) by mouth 2 (two) times daily as needed for muscle spasms. 20 tablet 0  . esomeprazole (NEXIUM) 40 MG capsule Take 40 mg by mouth daily as needed (acid reflux).    . metoprolol tartrate (LOPRESSOR) 25 MG tablet Take 1 tablet (25 mg total) by mouth 2 (two) times daily. 60 tablet 11  . tiZANidine (ZANAFLEX) 4 MG tablet Take 4 mg by mouth every 6 (six) hours as needed for muscle spasms.    . traMADol (ULTRAM) 50 MG tablet Take 50 mg by mouth every 6  (six) hours as needed (pain).    . propranolol (INDERAL) 10 MG tablet Take 1 tablet (10 mg total) by mouth 4 (four) times daily as needed. 60 tablet 6   No current facility-administered medications for this visit.     Allergies:   Patient has no known allergies.    Social History:  The patient  reports that she has never smoked. She has never used smokeless tobacco. She reports that she does not drink alcohol or use drugs.   Family History:  The patient's family history includes Cancer - Prostate in her maternal grandfather; Cirrhosis in her paternal grandfather; Diabetes in her mother; Healthy in her brother, daughter, daughter, maternal grandmother, and son; Heart Problems in her maternal grandfather; Heart attack in her paternal grandmother; Hypertension in her father and mother.    ROS:  Please see the history of present illness.    Review of Systems: Constitutional:  denies fever, chills, diaphoresis, appetite change and fatigue.  HEENT: denies photophobia, eye pain, redness, hearing loss, ear pain, congestion, sore throat, rhinorrhea, sneezing, neck pain, neck stiffness and tinnitus.  Respiratory: denies SOB, DOE, cough, chest tightness, and wheezing.  Cardiovascular: admits to chest pain,  palpitations   Gastrointestinal: denies nausea, vomiting, abdominal pain, diarrhea, constipation, blood in stool.  Genitourinary: denies dysuria, urgency, frequency, hematuria, flank pain and difficulty urinating.  Musculoskeletal: denies  myalgias, back pain, joint swelling, arthralgias and gait problem.   Skin: denies pallor, rash and wound.  Neurological: denies dizziness, seizures, syncope, weakness, light-headedness, numbness and headaches.   Hematological: denies adenopathy, easy bruising, personal or family bleeding history.  Psychiatric/ Behavioral: denies suicidal ideation, mood changes, confusion, nervousness, sleep disturbance and agitation.       All other systems are reviewed and  negative.    PHYSICAL EXAM: VS:  BP 102/70   Pulse 97   Ht 5\' 1"  (1.549 m)   Wt 137 lb 9.6 oz (62.4 kg)   LMP 06/05/2016   SpO2 98%   BMI 26.00 kg/m  , BMI Body mass index is 26 kg/m. GEN: Well nourished, well developed, in no acute distress  HEENT: normal  Neck: no JVD, carotid bruits, or masses Cardiac: RRR; no murmurs, rubs, or gallops,no edema  Respiratory:  clear to auscultation bilaterally, normal work of breathing GI: soft, nontender, nondistended, + BS MS: no deformity or atrophy  Skin: warm and dry, no rash Neuro:  Strength and sensation are intact Psych: normal   EKG:  EKG is ordered today. The ekg ordered today demonstrates NSR at 97 .  No St or T wave changes.     Recent Labs: 06/19/2016: BUN 7; Creatinine, Ser 0.73; Hemoglobin 12.0; Platelets 342; Potassium 3.8; Sodium 137    Lipid Panel No results found for: CHOL, TRIG, HDL, CHOLHDL, VLDL, LDLCALC, LDLDIRECT    Wt Readings from Last 3 Encounters:  07/09/16 137 lb 9.6 oz (62.4 kg)  06/19/16 136 lb (61.7 kg)  09/05/12 168 lb (76.2 kg)      Other studies Reviewed: Additional studies/ records that were reviewed today include: . Review of the above records demonstrates:    ASSESSMENT AND PLAN:  1.  [name]  presents with episodes of tachycardia.  Some of her symptoms are consistent with superventricular tachycardia. We've instructed her in the Valsalva maneuver. I'll give her propranolol to take on an as-needed basis.  She'll continue to take metoprolol on a scheduled basis.  We'll place a 30 day event monitor for further evaluation. I'll see her again in several months for follow-up visit.   Current medicines are reviewed at length with the patient today.  The patient does not have concerns regarding medicines.  Labs/ tests ordered today include:   Orders Placed This Encounter  Procedures  . TSH  . Cardiac event monitor  . EKG 12-Lead     Disposition:   FU with me in 3 months      Mertie Moores, MD  07/09/2016 10:03 AM    [facility] [address], [place], [place]  [address] Phone: [phone]; Fax: [phone]

## 2016-07-16 ENCOUNTER — Ambulatory Visit (INDEPENDENT_AMBULATORY_CARE_PROVIDER_SITE_OTHER): Payer: BLUE CROSS/BLUE SHIELD

## 2016-07-16 DIAGNOSIS — I471 Supraventricular tachycardia: Secondary | ICD-10-CM | POA: Diagnosis not present

## 2016-08-13 DIAGNOSIS — R05 Cough: Secondary | ICD-10-CM | POA: Diagnosis not present

## 2016-08-14 DIAGNOSIS — R05 Cough: Secondary | ICD-10-CM | POA: Diagnosis not present

## 2016-08-14 DIAGNOSIS — D649 Anemia, unspecified: Secondary | ICD-10-CM | POA: Diagnosis not present

## 2016-09-16 ENCOUNTER — Institutional Professional Consult (permissible substitution): Payer: Self-pay | Admitting: Internal Medicine

## 2016-10-06 ENCOUNTER — Ambulatory Visit: Payer: BLUE CROSS/BLUE SHIELD | Admitting: Cardiovascular Disease

## 2016-12-19 DIAGNOSIS — Z23 Encounter for immunization: Secondary | ICD-10-CM | POA: Diagnosis not present

## 2016-12-19 DIAGNOSIS — D649 Anemia, unspecified: Secondary | ICD-10-CM | POA: Diagnosis not present

## 2017-01-12 DIAGNOSIS — D649 Anemia, unspecified: Secondary | ICD-10-CM | POA: Diagnosis not present

## 2017-01-12 DIAGNOSIS — Z23 Encounter for immunization: Secondary | ICD-10-CM | POA: Diagnosis not present

## 2017-01-12 DIAGNOSIS — Z0184 Encounter for antibody response examination: Secondary | ICD-10-CM | POA: Diagnosis not present

## 2017-01-21 DIAGNOSIS — Z0184 Encounter for antibody response examination: Secondary | ICD-10-CM | POA: Diagnosis not present

## 2017-01-21 DIAGNOSIS — D649 Anemia, unspecified: Secondary | ICD-10-CM | POA: Diagnosis not present

## 2017-04-09 ENCOUNTER — Encounter: Payer: Self-pay | Admitting: Advanced Practice Midwife

## 2017-04-09 ENCOUNTER — Ambulatory Visit (INDEPENDENT_AMBULATORY_CARE_PROVIDER_SITE_OTHER): Payer: BLUE CROSS/BLUE SHIELD | Admitting: Advanced Practice Midwife

## 2017-04-09 VITALS — BP 116/78 | HR 102 | Ht 61.0 in | Wt 142.0 lb

## 2017-04-09 DIAGNOSIS — Z30011 Encounter for initial prescription of contraceptive pills: Secondary | ICD-10-CM

## 2017-04-09 DIAGNOSIS — Z01419 Encounter for gynecological examination (general) (routine) without abnormal findings: Secondary | ICD-10-CM | POA: Diagnosis not present

## 2017-04-09 DIAGNOSIS — Z124 Encounter for screening for malignant neoplasm of cervix: Secondary | ICD-10-CM

## 2017-04-09 DIAGNOSIS — F419 Anxiety disorder, unspecified: Secondary | ICD-10-CM | POA: Insufficient documentation

## 2017-04-09 DIAGNOSIS — D649 Anemia, unspecified: Secondary | ICD-10-CM | POA: Insufficient documentation

## 2017-04-09 DIAGNOSIS — G43909 Migraine, unspecified, not intractable, without status migrainosus: Secondary | ICD-10-CM | POA: Insufficient documentation

## 2017-04-09 MED ORDER — NORETHIN ACE-ETH ESTRAD-FE 1-20 MG-MCG PO TABS: 1.0000 | ORAL_TABLET | Freq: Every day | ORAL | 11 refills | Status: DC

## 2017-04-09 NOTE — Progress Notes (Signed)
Patient ID: Amanda Mooney, female   DOB: [birth year], 36 y.o.   MRN: [id number]     Gynecology Annual Exam  PCP: London Pepper, MD  Chief Complaint:  Chief Complaint  Patient presents with  . Gynecologic Exam    large clots with cycles    History of Present Illness: Patient is a 36 y.o. G3P1003 presents for annual exam. The patient has complaints today of heavy periods with large clots for 1.5 years. She uses nothing for birth control- if it happens it happens. She would like to try hormonal control for her heavy periods.    LMP: Patient's last menstrual period was 03/08/2017. Average Interval: regular, 28 days Duration of flow: 6 days Heavy Menses: yes Clots: yes Intermenstrual Bleeding: no Postcoital Bleeding: no Dysmenorrhea: no  The patient is sexually active. She currently uses none for contraception. She denies dyspareunia.  The patient does occasionally perform self breast exams.  There is no notable family history of breast or ovarian cancer in her family.  The patient wears seatbelts: yes.   The patient has regular exercise: yes.  She admits healthy diet but denies adequate intake of h2o.  The patient denies current symptoms of depression. She admits some anxiety and denies need for medication.   Review of Systems: Review of Systems  Constitutional: Negative.   HENT: Negative.   Eyes: Negative.   Respiratory: Negative.   Cardiovascular: Negative.   Gastrointestinal: Negative.   Genitourinary: Negative.   Musculoskeletal: Negative.   Skin: Negative.   Neurological: Negative.   Endo/Heme/Allergies: Negative.   Psychiatric/Behavioral: Negative.     Past Medical History:  Past Medical History:  Diagnosis Date  . Abnormal Pap smear   . Anemia   . Chlamydia   . Complication of anesthesia    hard to arrouse after "twilight" anasthsia  . Migraine   . Tachycardia     Past Surgical History:  Past Surgical History:  Procedure Laterality Date  . COLPOSCOPY     . HERNIA REPAIR      Gynecologic History:  Patient's last menstrual period was 03/08/2017. Contraception: none Last Pap: Results were: no abnormalities   Obstetric History: G3P1003  Family History:  Family History  Problem Relation Age of Onset  . Diabetes Mother   . Hypertension Mother   . Hypertension Father   . Healthy Maternal Grandmother   . Cancer - Prostate Maternal Grandfather   . Heart Problems Maternal Grandfather   . Heart attack Paternal Grandmother   . Cirrhosis Paternal Grandfather   . Healthy Brother   . Healthy Daughter   . Healthy Daughter   . Healthy Son     Social History:  Social History   Social History  . Marital status: Married    Spouse name: N/A  . Number of children: N/A  . Years of education: N/A   Occupational History  . Not on file.   Social History Main Topics  . Smoking status: Never Smoker  . Smokeless tobacco: Never Used  . Alcohol use No  . Drug use: No  . Sexual activity: Yes    Birth control/ protection: None   Other Topics Concern  . Not on file   Social History Narrative   ** Merged History Encounter **        Allergies:  No Known Allergies  Medications: Prior to Admission medications   Medication Sig Start Date End Date Taking? Authorizing Provider  aspirin-acetaminophen-caffeine (EXCEDRIN MIGRAINE) [phone] MG per tablet Take 1 tablet  by mouth every 6 (six) hours as needed for headache.   Yes [provider]  cyclobenzaprine (FLEXERIL) 10 MG tablet Take 1 tablet (10 mg total) by mouth 2 (two) times daily as needed for muscle spasms. 12/13/13  Yes Malvin Johns, MD  metoprolol tartrate (LOPRESSOR) 25 MG tablet Take 1 tablet (25 mg total) by mouth 2 (two) times daily. 07/09/16  Yes Nahser, Wonda Cheng, MD  propranolol (INDERAL) 10 MG tablet Take 1 tablet (10 mg total) by mouth 4 (four) times daily as needed. 07/09/16  Yes Nahser, Wonda Cheng, MD  SUMAtriptan (IMITREX) 25 MG tablet Take 25 mg by mouth every  2 (two) hours as needed for migraine. May repeat in 2 hours if headache persists or recurs.   Yes [provider]  tiZANidine (ZANAFLEX) 4 MG tablet Take 4 mg by mouth every 6 (six) hours as needed for muscle spasms.   Yes [provider]  norethindrone-ethinyl estradiol (JUNEL FE 1/20) 1-20 MG-MCG tablet Take 1 tablet by mouth daily. 04/09/17   Rod Can, CNM    Physical Exam Vitals: Blood pressure 116/78, pulse (!) 102, height 5\' 1"  (1.549 m), weight 142 lb (64.4 kg), last menstrual period 03/08/2017, not currently breastfeeding.  General: NAD HEENT: normocephalic, anicteric Thyroid: no enlargement, no palpable nodules Pulmonary: No increased work of breathing, CTAB Cardiovascular: RRR, distal pulses 2+ Breast: Breast symmetrical, no tenderness, no palpable nodules or masses, no skin or nipple retraction present, no nipple discharge.  No axillary or supraclavicular lymphadenopathy. Abdomen: NABS, soft, non-tender, non-distended.  Umbilicus without lesions.  No hepatomegaly, splenomegaly or masses palpable. No evidence of hernia  Genitourinary:  External: Normal external female genitalia.  Normal urethral meatus, normal  Bartholin's and Skene's glands.    Vagina: Normal vaginal mucosa, no evidence of prolapse.    Cervix: Grossly normal in appearance, no bleeding, no CMT  Uterus: Non-enlarged, mobile, normal contour.    Adnexa: ovaries non-enlarged, no adnexal masses  Rectal: deferred  Lymphatic: no evidence of inguinal lymphadenopathy Extremities: no edema, erythema, or tenderness Neurologic: Grossly intact Psychiatric: mood appropriate, affect full   Assessment: 36 y.o. G3P1003 routine annual exam  Plan: Problem List Items Addressed This Visit    None    Visit Diagnoses    Well woman exam with routine gynecological exam    -  Primary   Relevant Orders   IGP, Aptima HPV   Cervical cancer screening       Relevant Orders   IGP, Aptima HPV   Encounter for  initial prescription of contraceptive pills       Relevant Medications   norethindrone-ethinyl estradiol (JUNEL FE 1/20) 1-20 MG-MCG tablet      1) STI screening was offered and declined  2) ASCCP guidelines and rational discussed.  Patient opts for yearly screening interval  3) Contraception - patient is choosing OCP for heavy cycle control  4) Routine healthcare maintenance including cholesterol, diabetes screening discussed managed by PCP  5) Follow up 1 year for routine annual exam  Rod Can, CNM

## 2017-04-13 LAB — IGP, APTIMA HPV
HPV APTIMA: NEGATIVE
PAP SMEAR COMMENT: 0

## 2017-10-15 ENCOUNTER — Other Ambulatory Visit: Payer: Self-pay | Admitting: Cardiovascular Disease

## 2018-03-30 DIAGNOSIS — S76319A Strain of muscle, fascia and tendon of the posterior muscle group at thigh level, unspecified thigh, initial encounter: Secondary | ICD-10-CM | POA: Diagnosis not present

## 2018-03-30 DIAGNOSIS — D649 Anemia, unspecified: Secondary | ICD-10-CM | POA: Diagnosis not present

## 2018-03-30 DIAGNOSIS — H9209 Otalgia, unspecified ear: Secondary | ICD-10-CM | POA: Diagnosis not present

## 2018-03-30 DIAGNOSIS — M542 Cervicalgia: Secondary | ICD-10-CM | POA: Diagnosis not present

## 2018-04-28 ENCOUNTER — Encounter (HOSPITAL_COMMUNITY): Payer: Self-pay | Admitting: Emergency Medicine

## 2018-04-28 ENCOUNTER — Emergency Department (HOSPITAL_COMMUNITY)
Admission: EM | Admit: 2018-04-28 | Discharge: 2018-04-29 | Disposition: A | Discharge status: Home / Self Care | Payer: BLUE CROSS/BLUE SHIELD | Attending: Emergency Medicine | Admitting: Emergency Medicine

## 2018-04-28 ENCOUNTER — Emergency Department (HOSPITAL_COMMUNITY): Payer: BLUE CROSS/BLUE SHIELD

## 2018-04-28 ENCOUNTER — Other Ambulatory Visit: Payer: Self-pay

## 2018-04-28 DIAGNOSIS — R079 Chest pain, unspecified: Secondary | ICD-10-CM | POA: Diagnosis not present

## 2018-04-28 DIAGNOSIS — M542 Cervicalgia: Secondary | ICD-10-CM | POA: Diagnosis not present

## 2018-04-28 DIAGNOSIS — R0789 Other chest pain: Secondary | ICD-10-CM

## 2018-04-28 DIAGNOSIS — F419 Anxiety disorder, unspecified: Secondary | ICD-10-CM | POA: Insufficient documentation

## 2018-04-28 DIAGNOSIS — S199XXA Unspecified injury of neck, initial encounter: Secondary | ICD-10-CM | POA: Diagnosis not present

## 2018-04-28 DIAGNOSIS — Z79899 Other long term (current) drug therapy: Secondary | ICD-10-CM | POA: Insufficient documentation

## 2018-04-28 DIAGNOSIS — S299XXA Unspecified injury of thorax, initial encounter: Secondary | ICD-10-CM | POA: Diagnosis not present

## 2018-04-28 MED ORDER — METHOCARBAMOL 500 MG PO TABS: 500.0000 mg | ORAL_TABLET | Freq: Two times a day (BID) | ORAL | 0 refills | Status: DC

## 2018-04-28 MED ORDER — OXYCODONE-ACETAMINOPHEN 5-325 MG PO TABS: 1.0000 | ORAL_TABLET | ORAL | 0 refills | Status: DC | PRN

## 2018-04-28 MED ORDER — OXYCODONE-ACETAMINOPHEN 5-325 MG PO TABS
1.0000 | ORAL_TABLET | Freq: Once | ORAL | Status: AC
  Administered 2018-04-28: 1 via ORAL
  Filled 2018-04-28: qty 1
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
## 2018-04-28 NOTE — Discharge Instructions (Signed)
Take the prescribed medication as directed.  Can use heat therapy to help with soreness as well. Follow-up with your primary care doctor. Return to the ED for new or worsening symptoms.
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
## 2018-04-29 NOTE — ED Notes (Signed)
Patient verbalizes understanding of discharge instructions. Opportunity for questioning and answers were provided. Armband removed by staff, pt discharged from ED to home via POV  

## 2018-05-06 DIAGNOSIS — M542 Cervicalgia: Secondary | ICD-10-CM | POA: Diagnosis not present

## 2018-05-11 DIAGNOSIS — M47812 Spondylosis without myelopathy or radiculopathy, cervical region: Secondary | ICD-10-CM | POA: Diagnosis not present

## 2018-05-11 DIAGNOSIS — S134XXA Sprain of ligaments of cervical spine, initial encounter: Secondary | ICD-10-CM | POA: Diagnosis not present

## 2018-05-20 DIAGNOSIS — M542 Cervicalgia: Secondary | ICD-10-CM | POA: Diagnosis not present

## 2018-05-20 DIAGNOSIS — M5412 Radiculopathy, cervical region: Secondary | ICD-10-CM | POA: Diagnosis not present

## 2018-05-25 DIAGNOSIS — M542 Cervicalgia: Secondary | ICD-10-CM | POA: Diagnosis not present

## 2018-05-25 DIAGNOSIS — M5412 Radiculopathy, cervical region: Secondary | ICD-10-CM | POA: Diagnosis not present

## 2018-05-27 DIAGNOSIS — M5412 Radiculopathy, cervical region: Secondary | ICD-10-CM | POA: Diagnosis not present

## 2018-05-27 DIAGNOSIS — M542 Cervicalgia: Secondary | ICD-10-CM | POA: Diagnosis not present

## 2018-06-03 DIAGNOSIS — D649 Anemia, unspecified: Secondary | ICD-10-CM | POA: Diagnosis not present

## 2018-06-03 DIAGNOSIS — M5412 Radiculopathy, cervical region: Secondary | ICD-10-CM | POA: Diagnosis not present

## 2018-06-03 DIAGNOSIS — M542 Cervicalgia: Secondary | ICD-10-CM | POA: Diagnosis not present

## 2018-06-10 DIAGNOSIS — M542 Cervicalgia: Secondary | ICD-10-CM | POA: Diagnosis not present

## 2018-06-10 DIAGNOSIS — M5412 Radiculopathy, cervical region: Secondary | ICD-10-CM | POA: Diagnosis not present

## 2018-06-12 DIAGNOSIS — M5030 Other cervical disc degeneration, unspecified cervical region: Secondary | ICD-10-CM | POA: Diagnosis not present

## 2018-06-17 DIAGNOSIS — M542 Cervicalgia: Secondary | ICD-10-CM | POA: Diagnosis not present

## 2018-06-17 DIAGNOSIS — M5412 Radiculopathy, cervical region: Secondary | ICD-10-CM | POA: Diagnosis not present

## 2018-06-28 DIAGNOSIS — M5030 Other cervical disc degeneration, unspecified cervical region: Secondary | ICD-10-CM | POA: Diagnosis not present

## 2018-07-02 DIAGNOSIS — M542 Cervicalgia: Secondary | ICD-10-CM | POA: Diagnosis not present

## 2018-07-02 DIAGNOSIS — M5412 Radiculopathy, cervical region: Secondary | ICD-10-CM | POA: Diagnosis not present

## 2018-07-06 DIAGNOSIS — M5030 Other cervical disc degeneration, unspecified cervical region: Secondary | ICD-10-CM | POA: Diagnosis not present

## 2018-07-07 DIAGNOSIS — D649 Anemia, unspecified: Secondary | ICD-10-CM | POA: Diagnosis not present

## 2018-07-13 DIAGNOSIS — M5030 Other cervical disc degeneration, unspecified cervical region: Secondary | ICD-10-CM | POA: Diagnosis not present

## 2018-08-05 DIAGNOSIS — M5412 Radiculopathy, cervical region: Secondary | ICD-10-CM | POA: Diagnosis not present

## 2018-09-09 DIAGNOSIS — M5412 Radiculopathy, cervical region: Secondary | ICD-10-CM | POA: Diagnosis not present

## 2018-10-29 DIAGNOSIS — G43909 Migraine, unspecified, not intractable, without status migrainosus: Secondary | ICD-10-CM | POA: Diagnosis not present

## 2018-10-29 DIAGNOSIS — M5412 Radiculopathy, cervical region: Secondary | ICD-10-CM | POA: Diagnosis not present

## 2018-11-01 ENCOUNTER — Other Ambulatory Visit: Payer: Self-pay | Admitting: Neurosurgery

## 2018-11-01 DIAGNOSIS — M5412 Radiculopathy, cervical region: Secondary | ICD-10-CM

## 2018-11-08 ENCOUNTER — Other Ambulatory Visit: Payer: Self-pay | Admitting: Neurosurgery

## 2018-11-08 ENCOUNTER — Other Ambulatory Visit: Payer: Self-pay

## 2018-11-08 ENCOUNTER — Ambulatory Visit
Admission: RE | Admit: 2018-11-08 | Discharge: 2018-11-08 | Disposition: A | Discharge status: Home / Self Care | Payer: BLUE CROSS/BLUE SHIELD | Source: Ambulatory Visit | Attending: Neurosurgery | Admitting: Neurosurgery

## 2018-11-08 DIAGNOSIS — M5412 Radiculopathy, cervical region: Secondary | ICD-10-CM

## 2018-11-08 MED ORDER — TRIAMCINOLONE ACETONIDE 40 MG/ML IJ SUSP (RADIOLOGY): 60.0000 mg | Freq: Once | INTRAMUSCULAR | Status: DC

## 2018-11-08 MED ORDER — IOPAMIDOL (ISOVUE-M 300) INJECTION 61%: 1.0000 mL | Freq: Once | INTRAMUSCULAR | Status: DC | PRN

## 2018-12-02 DIAGNOSIS — M5412 Radiculopathy, cervical region: Secondary | ICD-10-CM | POA: Diagnosis not present

## 2018-12-21 DIAGNOSIS — M5020 Other cervical disc displacement, unspecified cervical region: Secondary | ICD-10-CM | POA: Diagnosis not present

## 2018-12-21 DIAGNOSIS — M5412 Radiculopathy, cervical region: Secondary | ICD-10-CM | POA: Diagnosis not present

## 2019-01-06 DIAGNOSIS — M5412 Radiculopathy, cervical region: Secondary | ICD-10-CM | POA: Diagnosis not present

## 2019-01-28 ENCOUNTER — Encounter
Admission: RE | Admit: 2019-01-28 | Discharge: 2019-01-28 | Disposition: A | Discharge status: Home / Self Care | Payer: BC Managed Care – PPO | Source: Ambulatory Visit | Attending: Neurosurgery | Admitting: Neurosurgery

## 2019-01-28 ENCOUNTER — Other Ambulatory Visit: Payer: Self-pay

## 2019-01-28 DIAGNOSIS — Z1159 Encounter for screening for other viral diseases: Secondary | ICD-10-CM | POA: Insufficient documentation

## 2019-01-28 DIAGNOSIS — Z01810 Encounter for preprocedural cardiovascular examination: Secondary | ICD-10-CM | POA: Diagnosis not present

## 2019-01-28 DIAGNOSIS — Z01818 Encounter for other preprocedural examination: Secondary | ICD-10-CM | POA: Insufficient documentation

## 2019-01-28 LAB — URINALYSIS, ROUTINE W REFLEX MICROSCOPIC
Bacteria, UA: NONE SEEN
Bilirubin Urine: NEGATIVE
Glucose, UA: NEGATIVE mg/dL
Hgb urine dipstick: NEGATIVE
Ketones, ur: NEGATIVE mg/dL
Leukocytes,Ua: NEGATIVE
Nitrite: NEGATIVE
Protein, ur: 30 mg/dL — AB
Specific Gravity, Urine: 1.035 — ABNORMAL HIGH (ref 1.005–1.030)
pH: 5 (ref 5.0–8.0)

## 2019-01-28 LAB — CBC
HCT: 29.1 % — ABNORMAL LOW (ref 36.0–46.0)
Hemoglobin: 9.2 g/dL — ABNORMAL LOW (ref 12.0–15.0)
MCH: 28 pg (ref 26.0–34.0)
MCHC: 31.6 g/dL (ref 30.0–36.0)
MCV: 88.7 fL (ref 80.0–100.0)
Platelets: 372 10*3/uL (ref 150–400)
RBC: 3.28 MIL/uL — ABNORMAL LOW (ref 3.87–5.11)
RDW: 13.5 % (ref 11.5–15.5)
WBC: 6 10*3/uL (ref 4.0–10.5)
nRBC: 0 % (ref 0.0–0.2)

## 2019-01-28 LAB — TYPE AND SCREEN
ABO/RH(D): O POS
Antibody Screen: NEGATIVE

## 2019-01-28 LAB — APTT: aPTT: 32 seconds (ref 24–36)

## 2019-01-28 LAB — SURGICAL PCR SCREEN
MRSA, PCR: POSITIVE — AB
Staphylococcus aureus: POSITIVE — AB

## 2019-01-28 LAB — PROTIME-INR
INR: 0.9 (ref 0.8–1.2)
Prothrombin Time: 12.5 seconds (ref 11.4–15.2)

## 2019-01-28 LAB — BASIC METABOLIC PANEL
Anion gap: 8 (ref 5–15)
BUN: 14 mg/dL (ref 6–20)
CO2: 25 mmol/L (ref 22–32)
Calcium: 8.8 mg/dL — ABNORMAL LOW (ref 8.9–10.3)
Chloride: 106 mmol/L (ref 98–111)
Creatinine, Ser: 0.65 mg/dL (ref 0.44–1.00)
GFR calc Af Amer: >60 mL/min (ref >60)
GFR calc non Af Amer: >60 mL/min (ref >60)
Glucose, Bld: 84 mg/dL (ref 70–99)
Potassium: 3.4 mmol/L — ABNORMAL LOW (ref 3.5–5.1)
Sodium: 139 mmol/L (ref 135–145)

## 2019-01-28 NOTE — Patient Instructions (Signed)
Your procedure is scheduled on: Tuesday 02/01/19 Report to [facility]. To find out your arrival time please call [phone] between 1PM - 3PM on Monday 01/31/19.  Remember: Instructions that are not followed completely may result in serious medical risk, up to and including death, or upon the discretion of your surgeon and anesthesiologist your surgery may need to be rescheduled.     _X__ 1. Do not eat food after midnight the night before your procedure.                 No gum chewing or hard candies. You may drink clear liquids up to 2 hours                 before you are scheduled to arrive for your surgery- DO not drink clear                 liquids within 2 hours of the start of your surgery.                 Clear Liquids include:  water, apple juice without pulp, clear carbohydrate                 drink such as Clearfast or Gatorade, Black Coffee or Tea (Do not add                 anything to coffee or tea).  __X__2.  On the morning of surgery brush your teeth with toothpaste and water, you                 may rinse your mouth with mouthwash if you wish.  Do not swallow any              toothpaste of mouthwash.     _X__ 3.  No Alcohol for 24 hours before or after surgery.   _X__ 4.  Do Not Smoke or use e-cigarettes For 24 Hours Prior to Your Surgery.                 Do not use any chewable tobacco products for at least 6 hours prior to                 surgery.  ____  5.  Bring all medications with you on the day of surgery if instructed.   __X__  6.  Notify your doctor if there is any change in your medical condition      (cold, fever, infections).     Do not wear jewelry, make-up, hairpins, clips or nail polish. Do not wear lotions, powders, or perfumes.  Do not shave 48 hours prior to surgery. Men may shave face and neck. Do not bring valuables to the hospital.    [facility] is not responsible for any belongings or  valuables.  Contacts, dentures/partials or body piercings may not be worn into surgery. Bring a case for your contacts, glasses or hearing aids, a denture cup will be supplied. Leave your suitcase in the car. After surgery it may be brought to your room. For patients admitted to the hospital, discharge time is determined by your treatment team.   Patients discharged the day of surgery will not be allowed to drive home.   Please read over the following fact sheets that you were given:   MRSA Information  __X__ Take these medicines the morning of surgery with A SIP OF WATER:  1. May take hydrocodone or tramadol for pain if needed  2.   3.   4.  5.  6.  ____ Fleet Enema (as directed)   __X__ Use CHG Soap/SAGE wipes as directed  ____ Use inhalers on the day of surgery  ____ Stop metformin/Janumet/Farxiga 2 days prior to surgery    ____ Take 1/2 of usual insulin dose the night before surgery. No insulin the morning          of surgery.   ____ Stop Blood Thinners Coumadin/Plavix/Xarelto/Pleta/Pradaxa/Eliquis/Effient/Aspirin  on   Or contact your Surgeon, Cardiologist or Medical Doctor regarding  ability to stop your blood thinners  __X__ Stop Anti-inflammatories 7 days before surgery such as Advil, Ibuprofen, Motrin,  BC or Goodies Powder, Naprosyn, Naproxen, Aleve, Aspirin    __X__ Stop all herbal supplements, fish oil or vitamin E until after surgery.    ____ Bring C-Pap to the hospital.

## 2019-01-28 NOTE — Pre-Procedure Instructions (Signed)
Copy of labs U/A and MRSA faxed to [facility] at [facility]  office

## 2019-01-28 NOTE — Pre-Procedure Instructions (Signed)
Anesthesia  Patient is concerned about her anxiety before and after surgery due to  no visitor policy.

## 2019-01-29 LAB — NOVEL CORONAVIRUS, NAA (HOSP ORDER, SEND-OUT TO REF LAB; TAT 18-24 HRS): SARS-CoV-2, NAA: NOT DETECTED

## 2019-01-31 MED ORDER — VANCOMYCIN HCL IN DEXTROSE 1-5 GM/200ML-% IV SOLN
1000.0000 mg | Freq: Once | INTRAVENOUS | Status: AC
  Administered 2019-02-01: 1000 mg via INTRAVENOUS

## 2019-02-01 ENCOUNTER — Encounter
Admission: RE | Disposition: A | Discharge status: Home / Self Care | Payer: Self-pay | Source: Home / Self Care | Attending: Neurosurgery

## 2019-02-01 ENCOUNTER — Ambulatory Visit: Payer: BC Managed Care – PPO | Admitting: Anesthesiology

## 2019-02-01 ENCOUNTER — Ambulatory Visit: Payer: BC Managed Care – PPO

## 2019-02-01 ENCOUNTER — Other Ambulatory Visit: Payer: Self-pay

## 2019-02-01 ENCOUNTER — Encounter: Payer: Self-pay | Admitting: *Deleted

## 2019-02-01 ENCOUNTER — Observation Stay
Admission: RE | Admit: 2019-02-01 | Discharge: 2019-02-02 | Disposition: A | Discharge status: Home / Self Care | Payer: BC Managed Care – PPO | Attending: Neurosurgery | Admitting: Neurosurgery

## 2019-02-01 DIAGNOSIS — Z981 Arthrodesis status: Secondary | ICD-10-CM

## 2019-02-01 DIAGNOSIS — G43909 Migraine, unspecified, not intractable, without status migrainosus: Secondary | ICD-10-CM | POA: Diagnosis not present

## 2019-02-01 DIAGNOSIS — M50123 Cervical disc disorder at C6-C7 level with radiculopathy: Secondary | ICD-10-CM | POA: Diagnosis not present

## 2019-02-01 DIAGNOSIS — M5412 Radiculopathy, cervical region: Secondary | ICD-10-CM | POA: Diagnosis not present

## 2019-02-01 DIAGNOSIS — Z419 Encounter for procedure for purposes other than remedying health state, unspecified: Secondary | ICD-10-CM

## 2019-02-01 HISTORY — PX: CERVICAL DISC ARTHROPLASTY: SHX587

## 2019-02-01 LAB — POCT PREGNANCY, URINE: Preg Test, Ur: NEGATIVE

## 2019-02-01 LAB — ABO/RH: ABO/RH(D): O POS

## 2019-02-01 SURGERY — CERVICAL ANTERIOR DISC ARTHROPLASTY
Anesthesia: General

## 2019-02-01 MED ORDER — ONDANSETRON HCL 4 MG/2ML IJ SOLN
INTRAMUSCULAR | Status: AC
  Filled 2019-02-01: qty 2

## 2019-02-01 MED ORDER — DEXAMETHASONE SODIUM PHOSPHATE 10 MG/ML IJ SOLN
INTRAMUSCULAR | Status: AC
  Filled 2019-02-01: qty 1

## 2019-02-01 MED ORDER — OXYCODONE HCL 5 MG PO TABS
10.0000 mg | ORAL_TABLET | ORAL | Status: DC | PRN
  Administered 2019-02-01 – 2019-02-02 (×3): 10 mg via ORAL
  Filled 2019-02-01 (×3): qty 2

## 2019-02-01 MED ORDER — SODIUM CHLORIDE 0.9% FLUSH: 3.0000 mL | INTRAVENOUS | Status: DC | PRN

## 2019-02-01 MED ORDER — REMIFENTANIL HCL 1 MG IV SOLR
INTRAVENOUS | Status: AC
  Filled 2019-02-01: qty 1000

## 2019-02-01 MED ORDER — HYDROMORPHONE HCL 1 MG/ML IJ SOLN
INTRAMUSCULAR | Status: AC
  Administered 2019-02-01: 0.5 mg via INTRAVENOUS
  Filled 2019-02-01: qty 1

## 2019-02-01 MED ORDER — ONDANSETRON HCL 4 MG/2ML IJ SOLN
INTRAMUSCULAR | Status: DC | PRN
  Administered 2019-02-01: 4 mg via INTRAVENOUS

## 2019-02-01 MED ORDER — DIPHENHYDRAMINE HCL 50 MG/ML IJ SOLN
INTRAMUSCULAR | Status: AC
  Filled 2019-02-01: qty 1

## 2019-02-01 MED ORDER — GLYCOPYRROLATE 0.2 MG/ML IJ SOLN
INTRAMUSCULAR | Status: DC | PRN
  Administered 2019-02-01: 0.2 mg via INTRAVENOUS

## 2019-02-01 MED ORDER — PROPOFOL 10 MG/ML IV BOLUS
INTRAVENOUS | Status: DC | PRN
  Administered 2019-02-01: 20 mg via INTRAVENOUS
  Administered 2019-02-01: 140 mg via INTRAVENOUS

## 2019-02-01 MED ORDER — SODIUM CHLORIDE (PF) 0.9 % IJ SOLN
INTRAMUSCULAR | Status: AC
  Filled 2019-02-01: qty 20

## 2019-02-01 MED ORDER — SUGAMMADEX SODIUM 200 MG/2ML IV SOLN
INTRAVENOUS | Status: DC | PRN
  Administered 2019-02-01: 130 mg via INTRAVENOUS

## 2019-02-01 MED ORDER — OXYCODONE HCL 5 MG PO TABS: 5.0000 mg | ORAL_TABLET | ORAL | 0 refills | Status: AC | PRN

## 2019-02-01 MED ORDER — ROCURONIUM BROMIDE 50 MG/5ML IV SOLN
INTRAVENOUS | Status: AC
  Filled 2019-02-01: qty 1

## 2019-02-01 MED ORDER — SUMATRIPTAN SUCCINATE 50 MG PO TABS
100.0000 mg | ORAL_TABLET | ORAL | Status: DC | PRN
  Filled 2019-02-01: qty 2

## 2019-02-01 MED ORDER — METHOCARBAMOL 1000 MG/10ML IJ SOLN
500.0000 mg | Freq: Four times a day (QID) | INTRAVENOUS | Status: DC
  Filled 2019-02-01: qty 5

## 2019-02-01 MED ORDER — OXYCODONE HCL 5 MG PO TABS: 5.0000 mg | ORAL_TABLET | ORAL | Status: DC | PRN

## 2019-02-01 MED ORDER — DEXMEDETOMIDINE HCL IN NACL 200 MCG/50ML IV SOLN
INTRAVENOUS | Status: AC
  Filled 2019-02-01: qty 50

## 2019-02-01 MED ORDER — SEVOFLURANE IN SOLN
RESPIRATORY_TRACT | Status: AC
  Filled 2019-02-01: qty 250

## 2019-02-01 MED ORDER — PHENYLEPHRINE HCL (PRESSORS) 10 MG/ML IV SOLN
INTRAVENOUS | Status: AC
  Filled 2019-02-01: qty 1

## 2019-02-01 MED ORDER — SUCCINYLCHOLINE CHLORIDE 20 MG/ML IJ SOLN
INTRAMUSCULAR | Status: DC | PRN
  Administered 2019-02-01: 100 mg via INTRAVENOUS

## 2019-02-01 MED ORDER — ACETAMINOPHEN 650 MG RE SUPP: 650.0000 mg | RECTAL | Status: DC | PRN

## 2019-02-01 MED ORDER — LACTATED RINGERS IV SOLN
INTRAVENOUS | Status: DC | PRN
  Administered 2019-02-01: 13:00:00 via INTRAVENOUS

## 2019-02-01 MED ORDER — FENTANYL CITRATE (PF) 100 MCG/2ML IJ SOLN
25.0000 ug | INTRAMUSCULAR | Status: DC | PRN
  Administered 2019-02-01 (×2): 50 ug via INTRAVENOUS

## 2019-02-01 MED ORDER — FERROUS SULFATE 325 (65 FE) MG PO TABS
325.0000 mg | ORAL_TABLET | ORAL | Status: DC
  Administered 2019-02-01: 325 mg via ORAL
  Filled 2019-02-01 (×2): qty 1

## 2019-02-01 MED ORDER — OXYCODONE HCL 5 MG PO TABS: 5.0000 mg | ORAL_TABLET | Freq: Once | ORAL | Status: DC | PRN

## 2019-02-01 MED ORDER — SENNA 8.6 MG PO TABS
1.0000 | ORAL_TABLET | Freq: Two times a day (BID) | ORAL | Status: DC
  Administered 2019-02-01 – 2019-02-02 (×2): 8.6 mg via ORAL
  Filled 2019-02-01 (×2): qty 1

## 2019-02-01 MED ORDER — PHENOL 1.4 % MT LIQD
1.0000 | OROMUCOSAL | Status: DC | PRN
  Filled 2019-02-01: qty 177

## 2019-02-01 MED ORDER — KETOROLAC TROMETHAMINE 15 MG/ML IJ SOLN
15.0000 mg | Freq: Four times a day (QID) | INTRAMUSCULAR | Status: DC
  Administered 2019-02-02: 15 mg via INTRAVENOUS
  Filled 2019-02-01 (×2): qty 1

## 2019-02-01 MED ORDER — SODIUM CHLORIDE 0.9 % IV SOLN: 250.0000 mL | INTRAVENOUS | Status: DC

## 2019-02-01 MED ORDER — FAMOTIDINE 20 MG PO TABS: 20.0000 mg | ORAL_TABLET | Freq: Once | ORAL | Status: DC

## 2019-02-01 MED ORDER — BISACODYL 5 MG PO TBEC: 5.0000 mg | DELAYED_RELEASE_TABLET | Freq: Every day | ORAL | Status: DC | PRN

## 2019-02-01 MED ORDER — LIDOCAINE HCL (PF) 2 % IJ SOLN
INTRAMUSCULAR | Status: AC
  Filled 2019-02-01: qty 10

## 2019-02-01 MED ORDER — MAGNESIUM CITRATE PO SOLN
1.0000 | Freq: Once | ORAL | Status: DC | PRN
  Filled 2019-02-01: qty 296

## 2019-02-01 MED ORDER — MEPERIDINE HCL 50 MG/ML IJ SOLN: 6.2500 mg | INTRAMUSCULAR | Status: DC | PRN

## 2019-02-01 MED ORDER — SODIUM CHLORIDE 0.9 % IV SOLN
INTRAVENOUS | Status: DC
  Administered 2019-02-01: 21:00:00 via INTRAVENOUS

## 2019-02-01 MED ORDER — ROCURONIUM BROMIDE 100 MG/10ML IV SOLN
INTRAVENOUS | Status: DC | PRN
  Administered 2019-02-01: 5 mg via INTRAVENOUS
  Administered 2019-02-01: 15 mg via INTRAVENOUS

## 2019-02-01 MED ORDER — MIDAZOLAM HCL 2 MG/2ML IJ SOLN
INTRAMUSCULAR | Status: AC
  Filled 2019-02-01: qty 2

## 2019-02-01 MED ORDER — PROMETHAZINE HCL 25 MG/ML IJ SOLN
INTRAMUSCULAR | Status: AC
  Administered 2019-02-01: 6.25 mg via INTRAVENOUS
  Filled 2019-02-01: qty 1

## 2019-02-01 MED ORDER — THROMBIN 5000 UNITS EX SOLR
CUTANEOUS | Status: DC | PRN
  Administered 2019-02-01: 5000 [IU] via TOPICAL

## 2019-02-01 MED ORDER — OXYCODONE HCL 5 MG/5ML PO SOLN: 5.0000 mg | Freq: Once | ORAL | Status: DC | PRN

## 2019-02-01 MED ORDER — GLYCOPYRROLATE 0.2 MG/ML IJ SOLN
INTRAMUSCULAR | Status: AC
  Filled 2019-02-01: qty 1

## 2019-02-01 MED ORDER — METHOCARBAMOL 1000 MG/10ML IJ SOLN
500.0000 mg | Freq: Once | INTRAVENOUS | Status: AC
  Administered 2019-02-01: 500 mg via INTRAVENOUS
  Filled 2019-02-01: qty 5

## 2019-02-01 MED ORDER — BUPIVACAINE-EPINEPHRINE (PF) 0.5% -1:200000 IJ SOLN
INTRAMUSCULAR | Status: DC | PRN
  Administered 2019-02-01: 6 mL via PERINEURAL

## 2019-02-01 MED ORDER — MENTHOL 3 MG MT LOZG
1.0000 | LOZENGE | OROMUCOSAL | Status: DC | PRN
  Filled 2019-02-01: qty 9

## 2019-02-01 MED ORDER — ONDANSETRON HCL 4 MG/2ML IJ SOLN: 4.0000 mg | Freq: Four times a day (QID) | INTRAMUSCULAR | Status: DC | PRN

## 2019-02-01 MED ORDER — HYDROMORPHONE HCL 1 MG/ML IJ SOLN
0.5000 mg | INTRAMUSCULAR | Status: AC | PRN
  Administered 2019-02-01 (×4): 0.5 mg via INTRAVENOUS

## 2019-02-01 MED ORDER — SODIUM CHLORIDE FLUSH 0.9 % IV SOLN
INTRAVENOUS | Status: AC
  Filled 2019-02-01: qty 10

## 2019-02-01 MED ORDER — REMIFENTANIL HCL 1 MG IV SOLR
INTRAVENOUS | Status: DC | PRN
  Administered 2019-02-01: .1 ug/kg/min via INTRAVENOUS

## 2019-02-01 MED ORDER — MIDAZOLAM HCL 2 MG/2ML IJ SOLN
INTRAMUSCULAR | Status: DC | PRN
  Administered 2019-02-01: 2 mg via INTRAVENOUS

## 2019-02-01 MED ORDER — TIZANIDINE HCL 4 MG PO TABS: 4.0000 mg | ORAL_TABLET | Freq: Four times a day (QID) | ORAL | 0 refills | Status: DC | PRN

## 2019-02-01 MED ORDER — DEXMEDETOMIDINE HCL IN NACL 200 MCG/50ML IV SOLN
INTRAVENOUS | Status: DC | PRN
  Administered 2019-02-01: 8 ug via INTRAVENOUS
  Administered 2019-02-01: 4 ug via INTRAVENOUS

## 2019-02-01 MED ORDER — LACTATED RINGERS IV SOLN
INTRAVENOUS | Status: DC
  Administered 2019-02-01: 12:00:00 via INTRAVENOUS

## 2019-02-01 MED ORDER — MIDAZOLAM HCL 2 MG/2ML IJ SOLN
1.0000 mg | Freq: Once | INTRAMUSCULAR | Status: AC
  Administered 2019-02-01: 1 mg via INTRAVENOUS

## 2019-02-01 MED ORDER — PROMETHAZINE HCL 25 MG/ML IJ SOLN
6.2500 mg | INTRAMUSCULAR | Status: DC | PRN
  Administered 2019-02-01: 6.25 mg via INTRAVENOUS

## 2019-02-01 MED ORDER — ACETAMINOPHEN 10 MG/ML IV SOLN
INTRAVENOUS | Status: DC | PRN
  Administered 2019-02-01: 1000 mg via INTRAVENOUS

## 2019-02-01 MED ORDER — DEXAMETHASONE SODIUM PHOSPHATE 10 MG/ML IJ SOLN
INTRAMUSCULAR | Status: DC | PRN
  Administered 2019-02-01: 10 mg via INTRAVENOUS

## 2019-02-01 MED ORDER — SODIUM CHLORIDE 0.9% FLUSH
INTRAVENOUS | Status: DC | PRN
  Administered 2019-02-01: 10 mL

## 2019-02-01 MED ORDER — POLYETHYLENE GLYCOL 3350 17 G PO PACK: 17.0000 g | PACK | Freq: Every day | ORAL | Status: DC | PRN

## 2019-02-01 MED ORDER — SUCCINYLCHOLINE CHLORIDE 20 MG/ML IJ SOLN
INTRAMUSCULAR | Status: AC
  Filled 2019-02-01: qty 1

## 2019-02-01 MED ORDER — ONDANSETRON HCL 4 MG PO TABS: 4.0000 mg | ORAL_TABLET | Freq: Four times a day (QID) | ORAL | Status: DC | PRN

## 2019-02-01 MED ORDER — FENTANYL CITRATE (PF) 100 MCG/2ML IJ SOLN
INTRAMUSCULAR | Status: AC
  Filled 2019-02-01: qty 2

## 2019-02-01 MED ORDER — FENTANYL CITRATE (PF) 100 MCG/2ML IJ SOLN
INTRAMUSCULAR | Status: DC | PRN
  Administered 2019-02-01 (×2): 25 ug via INTRAVENOUS
  Administered 2019-02-01: 50 ug via INTRAVENOUS
  Administered 2019-02-01 (×2): 25 ug via INTRAVENOUS
  Administered 2019-02-01: 50 ug via INTRAVENOUS

## 2019-02-01 MED ORDER — SODIUM CHLORIDE 0.9 % IR SOLN
Status: DC | PRN
  Administered 2019-02-01: 1000 mL

## 2019-02-01 MED ORDER — PHENYLEPHRINE HCL (PRESSORS) 10 MG/ML IV SOLN
INTRAVENOUS | Status: DC | PRN
  Administered 2019-02-01 (×5): 100 ug via INTRAVENOUS

## 2019-02-01 MED ORDER — METHOCARBAMOL 500 MG PO TABS
500.0000 mg | ORAL_TABLET | Freq: Four times a day (QID) | ORAL | Status: DC
  Administered 2019-02-01 – 2019-02-02 (×2): 500 mg via ORAL
  Filled 2019-02-01 (×2): qty 1

## 2019-02-01 MED ORDER — SODIUM CHLORIDE 0.9% FLUSH
3.0000 mL | Freq: Two times a day (BID) | INTRAVENOUS | Status: DC
  Administered 2019-02-01 – 2019-02-02 (×2): 3 mL via INTRAVENOUS

## 2019-02-01 MED ORDER — PROPOFOL 10 MG/ML IV BOLUS
INTRAVENOUS | Status: AC
  Filled 2019-02-01: qty 20

## 2019-02-01 MED ORDER — HYDROMORPHONE HCL 1 MG/ML IJ SOLN: 0.5000 mg | INTRAMUSCULAR | Status: DC | PRN

## 2019-02-01 MED ORDER — ASPIRIN-ACETAMINOPHEN-CAFFEINE 250-250-65 MG PO TABS
1.0000 | ORAL_TABLET | Freq: Four times a day (QID) | ORAL | Status: DC | PRN
  Filled 2019-02-01: qty 1

## 2019-02-01 MED ORDER — ACETAMINOPHEN 10 MG/ML IV SOLN
INTRAVENOUS | Status: AC
  Filled 2019-02-01: qty 100

## 2019-02-01 MED ORDER — PROMETHAZINE HCL 25 MG/ML IJ SOLN: 6.2500 mg | INTRAMUSCULAR | Status: DC | PRN

## 2019-02-01 MED ORDER — VANCOMYCIN HCL IN DEXTROSE 1-5 GM/200ML-% IV SOLN
INTRAVENOUS | Status: AC
  Filled 2019-02-01: qty 200

## 2019-02-01 MED ORDER — ACETAMINOPHEN 325 MG PO TABS: 650.0000 mg | ORAL_TABLET | ORAL | Status: DC | PRN

## 2019-02-01 MED ORDER — KETOROLAC TROMETHAMINE 30 MG/ML IJ SOLN
30.0000 mg | Freq: Once | INTRAMUSCULAR | Status: AC
  Administered 2019-02-01: 30 mg via INTRAVENOUS
  Filled 2019-02-01 (×2): qty 1

## 2019-02-01 MED ORDER — ACETAMINOPHEN 500 MG PO TABS
1000.0000 mg | ORAL_TABLET | Freq: Four times a day (QID) | ORAL | Status: DC
  Administered 2019-02-01 – 2019-02-02 (×2): 1000 mg via ORAL
  Filled 2019-02-01 (×2): qty 2

## 2019-02-01 MED ORDER — FENTANYL CITRATE (PF) 100 MCG/2ML IJ SOLN
INTRAMUSCULAR | Status: AC
  Administered 2019-02-01: 50 ug via INTRAVENOUS
  Filled 2019-02-01: qty 2

## 2019-02-01 SURGICAL SUPPLY — 51 items
BUR NEURO DRILL SOFT 3.0X3.8M (BURR) ×2 IMPLANT
CANISTER SUCT 1200ML W/VALVE (MISCELLANEOUS) ×4 IMPLANT
CHLORAPREP W/TINT 26 (MISCELLANEOUS) ×4 IMPLANT
COUNTER NEEDLE 20/40 LG (NEEDLE) ×2 IMPLANT
COVER LIGHT HANDLE STERIS (MISCELLANEOUS) ×4 IMPLANT
COVER WAND RF STERILE (DRAPES) ×2 IMPLANT
CRADLE LAMINECT ARM (MISCELLANEOUS) ×2 IMPLANT
DERMABOND ADVANCED (GAUZE/BANDAGES/DRESSINGS) ×1
DERMABOND ADVANCED .7 DNX12 (GAUZE/BANDAGES/DRESSINGS) ×1 IMPLANT
DISC MOBI-C CERVICAL 13X15 H5 (Miscellaneous) ×2 IMPLANT
DRAPE C-ARM 42X72 X-RAY (DRAPES) ×4 IMPLANT
DRAPE LAPAROTOMY 77X122 PED (DRAPES) ×2 IMPLANT
DRAPE MICROSCOPE SPINE 48X150 (DRAPES) ×2 IMPLANT
DRAPE POUCH INSTRU U-SHP 10X18 (DRAPES) ×2 IMPLANT
DRAPE SURG 17X11 SM STRL (DRAPES) ×8 IMPLANT
ELECT CAUTERY BLADE TIP 2.5 (TIP) ×2
ELECT REM PT RETURN 9FT ADLT (ELECTROSURGICAL) ×2
ELECTRODE CAUTERY BLDE TIP 2.5 (TIP) ×1 IMPLANT
ELECTRODE REM PT RTRN 9FT ADLT (ELECTROSURGICAL) ×1 IMPLANT
FEE INTRAOP MONITOR IMPULS NCS (MISCELLANEOUS) IMPLANT
FRAME EYE SHIELD (PROTECTIVE WEAR) ×4 IMPLANT
GLOVE BIOGEL PI IND STRL 7.0 (GLOVE) ×1 IMPLANT
GLOVE BIOGEL PI INDICATOR 7.0 (GLOVE) ×1
GLOVE SURG SYN 7.0 (GLOVE) ×4 IMPLANT
GLOVE SURG SYN 8.5  E (GLOVE) ×3
GLOVE SURG SYN 8.5 E (GLOVE) ×3 IMPLANT
GOWN SRG XL LVL 3 NONREINFORCE (GOWNS) ×1 IMPLANT
GOWN STRL NON-REIN TWL XL LVL3 (GOWNS) ×1
GOWN STRL REUS W/TWL MED LVL3 (GOWN DISPOSABLE) ×2 IMPLANT
GRADUATE 1200CC STRL 31836 (MISCELLANEOUS) ×2 IMPLANT
INTRAOP MONITOR FEE IMPULS NCS (MISCELLANEOUS)
INTRAOP MONITOR FEE IMPULSE (MISCELLANEOUS)
KIT TURNOVER KIT A (KITS) ×2 IMPLANT
MARKER SKIN DUAL TIP RULER LAB (MISCELLANEOUS) ×2 IMPLANT
NDL SAFETY ECLIPSE 18X1.5 (NEEDLE) IMPLANT
NEEDLE HYPO 18GX1.5 SHARP (NEEDLE)
NEEDLE HYPO 22GX1.5 SAFETY (NEEDLE) ×2 IMPLANT
NS IRRIG 1000ML POUR BTL (IV SOLUTION) ×2 IMPLANT
PACK LAMINECTOMY NEURO (CUSTOM PROCEDURE TRAY) ×2 IMPLANT
PIN CASPAR SPINAL 12MM (PIN) ×2 IMPLANT
SPOGE SURGIFLO 8M (HEMOSTASIS) ×1
SPONGE KITTNER 5P (MISCELLANEOUS) ×2 IMPLANT
SPONGE SURGIFLO 8M (HEMOSTASIS) ×1 IMPLANT
STAPLER SKIN PROX 35W (STAPLE) IMPLANT
SUT V-LOC 90 ABS DVC 3-0 CL (SUTURE) ×2 IMPLANT
SUT VIC AB 3-0 SH 8-18 (SUTURE) ×2 IMPLANT
SYR 30ML LL (SYRINGE) ×2 IMPLANT
TAPE CLOTH 3X10 WHT NS LF (GAUZE/BANDAGES/DRESSINGS) ×2 IMPLANT
TOWEL OR 17X26 4PK STRL BLUE (TOWEL DISPOSABLE) ×6 IMPLANT
TRAY FOLEY MTR SLVR 16FR STAT (SET/KITS/TRAYS/PACK) IMPLANT
TUBING CONNECTING 10 (TUBING) ×2 IMPLANT

## 2019-02-01 NOTE — Anesthesia Post-op Follow-up Note (Signed)
Anesthesia QCDR form completed.        

## 2019-02-01 NOTE — Op Note (Signed)
Indications: Mrs. Limones is a 38 yo female who presented with cervical radiculopathy.  She tried and failed conservative management, and elected for surgical intervention.  Findings: disc herniation at C6-7  Preoperative Diagnosis: Cervical radiculopathy Postoperative Diagnosis: same   EBL: 50 ml IVF: 1200 ml Drains: none Disposition: Extubated and Stable to PACU Complications: none  No foley catheter was placed.   Preoperative Note:   Risks of surgery discussed include: infection, bleeding, stroke, coma, death, paralysis, CSF leak, nerve/spinal cord injury, numbness, tingling, weakness, complex regional pain syndrome, recurrent stenosis and/or disc herniation, vascular injury, development of instability, neck/back pain, need for further surgery, persistent symptoms, development of deformity, and the risks of anesthesia. The patient understood these risks and agreed to proceed.  Operative Note:  Procedure:  1) Cervical Disc Arthroplasty at C6/7 using a LDR Mobi-C device   Procedure: After obtaining informed consent, the patient taken to the operating room, placed in supine position, general anesthesia induced.  The patient had a small shoulder roll placed behind the neck.  The patient received preop antibiotics and IV Decadron.  The patient had a neck incision outlined, was prepped and draped in usual sterile fashion. The incision was injected with local anesthetic.   An incision was opened, dissection taken down medial to the carotid artery and jugular vein, lateral to the trachea and esophagus.  The prevertebral fascia identified and a localizing x-ray demonstrated the correct level.  The longus colli were dissected laterally, and self-retaining retractors placed to open the operative field. The microscope was then brought into the field.  With this complete, distractor pins were placed in the vertebral bodies of C6 and C7. The distractor was placed, and the annulus at C6/7 was opened  using a bovie.  Curettes and pituitary rongeurs used to remove the majority of disk, then the drill was used to remove the posterior osteophyte and begin the foraminotomies. The nerve hook was used to elevate the posterior longitudinal ligament, which was then removed with Kerrison rongeurs. A large disc herniation was noted. The microblunt nerve hook could be passed out the foramen bilaterally.   Meticulous hemostasis was obtained.  A trial spacer was used to size the disc space. Using flouroscopic guidance, a 15 mm width x 13 mm depth x 5 mm height Mobi-C was then inserted in the prepared disc space.  The caspar distractor was removed, and bone wax used for hemostasis. Final AP and lateral radiographs were taken.   With the disc arthroplasty in good position, the wound was irrigated copiously with bacitracin-containing solution and meticulous hemostasis obtained.  Wound was closed in 2 layers using interrupted inverted 3-0 Vicryl sutures.  The wound was dressed with dermabond, the head of bed at 30 degrees, taken to recovery room in stable condition.  No new postop neurological deficits were identified.  Sponge and pattie counts were correct at the end of the procedure.     I performed the entire procedure with the assistance of Marin Olp PA as an Pensions consultant.  Meade Maw MD

## 2019-02-01 NOTE — Anesthesia Preprocedure Evaluation (Signed)
Anesthesia Evaluation  Patient identified by MRN, date of birth, ID band Patient awake    Reviewed: Allergy & Precautions, NPO status , Patient's Chart, lab work & pertinent test results  History of Anesthesia Complications (+) PROLONGED EMERGENCE and history of anesthetic complications (prolonged wake up after D&C with "twilight" anesthesia)  Airway Mallampati: II  TM Distance: >3 FB Neck ROM: Full    Dental no notable dental hx.    Pulmonary neg pulmonary ROS, neg sleep apnea, neg COPD,    breath sounds clear to auscultation- rhonchi (-) wheezing      Cardiovascular Exercise Tolerance: Good (-) hypertension(-) CAD, (-) Past MI, (-) Cardiac Stents and (-) CABG  Rhythm:Regular Rate:Normal - Systolic murmurs and - Diastolic murmurs    Neuro/Psych  Headaches, neg Seizures Anxiety    GI/Hepatic negative GI ROS, Neg liver ROS,   Endo/Other  negative endocrine ROSneg diabetes  Renal/GU negative Renal ROS     Musculoskeletal negative musculoskeletal ROS (+)   Abdominal (+) - obese,   Peds  Hematology  (+) anemia ,   Anesthesia Other Findings Past Medical History: No date: Abnormal Pap smear No date: Anemia No date: Chlamydia No date: Complication of anesthesia     Comment:  hard to arrouse after "twilight" anesthesia No date: Migraine No date: Tachycardia   Reproductive/Obstetrics                             Anesthesia Physical Anesthesia Plan  ASA: II  Anesthesia Plan: General   Post-op Pain Management:    Induction: Intravenous  PONV Risk Score and Plan: 2 and Ondansetron, Midazolam and Dexamethasone  Airway Management Planned: Oral ETT  Additional Equipment:   Intra-op Plan:   Post-operative Plan: Extubation in OR  Informed Consent: I have reviewed the patients History and Physical, chart, labs and discussed the procedure including the risks, benefits and alternatives for  the proposed anesthesia with the patient or authorized representative who has indicated his/her understanding and acceptance.     Dental advisory given  Plan Discussed with: CRNA and Anesthesiologist  Anesthesia Plan Comments:         Anesthesia Quick Evaluation

## 2019-02-01 NOTE — Transfer of Care (Signed)
Immediate Anesthesia Transfer of Care Note  Patient: Amanda Mooney  Procedure(s) Performed: CERVICAL ANTERIOR DISC ARTHROPLASTY  C6-7 (N/A )  Patient Location: PACU  Anesthesia Type:General  Level of Consciousness: sedated  Airway & Oxygen Therapy: Patient Spontanous Breathing and Patient connected to face mask oxygen  Post-op Assessment: Report given to RN and Post -op Vital signs reviewed and stable  Post vital signs: Reviewed and unstable  Last Vitals:  Vitals Value Taken Time  BP 96/69 02/01/2019  3:54 PM  Temp 36.4 C 02/01/2019  3:54 PM  Pulse 119 02/01/2019  4:01 PM  Resp 17 02/01/2019  4:01 PM  SpO2 100 % 02/01/2019  4:01 PM  Vitals shown include unvalidated device data.  Last Pain:  Vitals:   02/01/19 1159  TempSrc: Oral  PainSc: 6       Patients Stated Pain Goal: 3 (09/38/18 2993)  Complications: No apparent anesthesia complications

## 2019-02-01 NOTE — Discharge Summary (Signed)
Procedure: C6-7 arthroplasty Procedure date: 02/01/2019 Diagnosis: Cervical radiculopathy  History: Amanda Mooney is s/p C6-7 arthroplasty for cervical radiculopathy.  POD1: Recovering well. Pain currently 5/10, anterior cervical pain. Upper extremity pain has resolved as well as the majority of the numbness she was experiencing prior to surgery. Denies any new pain/numbness/tingling. She has ambulated, eaten, and voided without issue. Denies swallowing issues.   POD0: Tolerated procedure well.  Complained of significant posterior neck pain postoperatively.  Due to pain control medications, she was unable to clarify if upper extremity pain has resolved.  Physical Exam: Vitals:   02/02/19 0100 02/02/19 0336  BP: 120/64 121/79  Pulse: 83 76  Resp: 18 18  Temp: 98.5 F (36.9 C) (!) 97.5 F (36.4 C)  SpO2: 100% 99%   Strength: 5/5 throughout upper extremities except left tricep 4+/5.  Sensation: Decreased sensation through medial aspect of left hand Skin: Glue intact at incision site. No swelling, tenderness, drainage, or bleeding.   Data:  Recent Labs  Lab 01/28/19 1149  NA 139  K 3.4*  CL 106  CO2 25  BUN 14  CREATININE 0.65  GLUCOSE 84  CALCIUM 8.8*   No results for input(s): AST, ALT, ALKPHOS in the last 168 hours.  Invalid input(s): TBILI   Recent Labs  Lab 01/28/19 1149  WBC 6.0  HGB 9.2*  HCT 29.1*  PLT 372   Recent Labs  Lab 01/28/19 1149  APTT 32  INR 0.9         Other tests/results:  EXAM: CERVICAL SPINE - 2-3 VIEW 02/01/2019  COMPARISON:  Intraoperative images from 02/01/2019  FINDINGS: Mobi-C disc arthroplasty device noted at the C6-7 level. Anteriorly the distance between the plate components is 0.6 cm and posteriorly the distance is 0.2 cm, with tilt allowed by the mobile core component. No complicating feature is identified. There is a small amount of postoperative gas in the soft tissues anterior and primarily eccentric to the left  along the lower neck. No significant cervical malalignment.  IMPRESSION: 1. No complicating features at the site of the C6-7 Mobi-C disc arthroplasty device.  Assessment/Plan:  Amanda Mooney POD 1 status post C6-7 arthroplasty for cervical radiculopathy. Post op pain has improved. Symptoms that were present prior to surgery have also improved. Ambulated, eaten, and voided without issue. Will continue post op pain control with ibuprofen, tylenol, muscle relaxer, and pain medication as needed. Discussed activity restrictions and wound care.She is scheduled to follow up in clinic in approximately 2 weeks to monitor progress. Advised to contact office if any questions or concerns arise before then.   Marin Olp PA-C Department of Neurosurgery

## 2019-02-01 NOTE — Anesthesia Procedure Notes (Addendum)
Procedure Name: Intubation Date/Time: 02/01/2019 1:14 PM Performed by: Dionne Bucy, CRNA Pre-anesthesia Checklist: Patient identified, Patient being monitored, Timeout performed, Emergency Drugs available and Suction available Patient Re-evaluated:Patient Re-evaluated prior to induction Oxygen Delivery Method: Circle system utilized Preoxygenation: Pre-oxygenation with 100% oxygen Induction Type: IV induction Ventilation: Mask ventilation without difficulty Laryngoscope Size: 3 and McGraph Grade View: Grade I Tube type: Oral Tube size: 7.0 mm Number of attempts: 1 Airway Equipment and Method: Stylet and Video-laryngoscopy Placement Confirmation: ETT inserted through vocal cords under direct vision,  positive ETCO2 and breath sounds checked- equal and bilateral Secured at: 21 cm Tube secured with: Tape Dental Injury: Teeth and Oropharynx as per pre-operative assessment

## 2019-02-01 NOTE — Progress Notes (Signed)
Procedure: C6-7 arthroplasty Procedure date: 02/01/2019 Diagnosis: Cervical radiculopathy  History: Amanda Mooney is s/p C6-7 arthroplasty for cervical radiculopathy. POD0: Tolerated procedure well.  Complained of significant posterior neck pain postoperatively.  Due to pain control medications, she was unable to clarify if upper extremity pain has resolved.  Physical Exam: Vitals:   02/01/19 1915 02/01/19 1930  BP: 120/85   Pulse: 91 81  Resp: 10 10  Temp:    SpO2: 100% 100%   Strength: All extremities independently.  Unable to obtain full strength assessment Sensation: Unable to obtain Skin: Glue intact at incision site  Data:  Recent Labs  Lab 01/28/19 1149  NA 139  K 3.4*  CL 106  CO2 25  BUN 14  CREATININE 0.65  GLUCOSE 84  CALCIUM 8.8*   No results for input(s): AST, ALT, ALKPHOS in the last 168 hours.  Invalid input(s): TBILI   Recent Labs  Lab 01/28/19 1149  WBC 6.0  HGB 9.2*  HCT 29.1*  PLT 372   Recent Labs  Lab 01/28/19 1149  APTT 32  INR 0.9         Other tests/results:  EXAM: CERVICAL SPINE - 2-3 VIEW 02/01/2019  COMPARISON:  Intraoperative images from 02/01/2019  FINDINGS: Mobi-C disc arthroplasty device noted at the C6-7 level. Anteriorly the distance between the plate components is 0.6 cm and posteriorly the distance is 0.2 cm, with tilt allowed by the mobile core component. No complicating feature is identified. There is a small amount of postoperative gas in the soft tissues anterior and primarily eccentric to the left along the lower neck. No significant cervical malalignment.  IMPRESSION: 1. No complicating features at the site of the C6-7 Mobi-C disc arthroplasty device.  Assessment/Plan:  Amanda Mooney POD 0 status post C6-7 arthroplasty for cervical radiculopathy.  She is complaining of significant posterior cervical spine pain postoperatively.  We will continue to monitor for adequate pain control and and cervical  radiculopathy symptom resolution.  - mobilize - pain control - DVT prophylaxis   Amanda Olp PA-C Department of Neurosurgery

## 2019-02-01 NOTE — Discharge Instructions (Addendum)
Your surgeon has performed an operation on your cervical spine (neck) to relieve pressure on the spinal cord and/or nerves. This involved making an incision in the front of your neck and removing one or more of the discs that support your spine. Next, a small piece of bone, a titanium plate, and screws were used to fuse two or more of the vertebrae (bones) together.  The following are instructions to help in your recovery once you have been discharged from the hospital. Even if you feel well, it is important that you follow these activity guidelines. If you do not let your neck heal properly from the surgery, you can increase the chance of return of your symptoms and other complications.  *You may take NSAIDs and tylenol for pain.. Muscle relaxer as needed. Pain medication for breakthrough pain.   Activity    No bending, lifting, or twisting (BLT). Avoid lifting objects heavier than 10 pounds (gallon milk jug).  Where possible, avoid household activities that involve lifting, bending, reaching, pushing, or pulling such as laundry, vacuuming, grocery shopping, and childcare. Try to arrange for help from friends and family for these activities while your back heals.  Increase physical activity slowly as tolerated.  Taking short walks is encouraged, but avoid strenuous exercise. Do not jog, run, bicycle, lift weights, or participate in any other exercises unless specifically allowed by your doctor.  Talk to your doctor before resuming sexual activity.  You should not drive until cleared by your doctor.  Until released by your doctor, you should not return to work or school.  You should rest at home and let your body heal.   You may shower three days after your surgery.  After showering, lightly dab your incision dry. Do not take a tub bath or go swimming until approved by your doctor at your follow-up appointment.  If you smoke, we strongly recommend that you quit.  Smoking has been proven to  interfere with normal bone healing and will dramatically reduce the success rate of your surgery. Please contact [facility] (800-QUIT-NOW) and use the resources at www.[facility].com for assistance in stopping smoking.  Surgical Incision   If you have a dressing on your incision, you may remove it two days after your surgery. Keep your incision area clean and dry.  If you have staples or stitches on your incision, you should have a follow up scheduled for removal. If you do not have staples or stitches, you will have steri-strips (small pieces of surgical tape) or Dermabond glue. The steri-strips/glue should begin to peel away within about a week (it is fine if the steri-strips fall off before then). If the strips are still in place one week after your surgery, you may gently remove them.  Diet           You may return to your usual diet. However, you may experience discomfort when swallowing in the first month after your surgery. This is normal. You may find that softer foods are more comfortable for you to swallow. Be sure to stay hydrated.  When to Contact us  You may experience pain in your neck and/or pain between your shoulder blades. This is normal and should improve in the next few weeks with the help of pain medication, muscle relaxers, and rest. Some patients report that a warm compress on the back of the neck or between the shoulder blades helps.  However, should you experience any of the following, contact us immediately:  New numbness or weakness  Pain that is progressively getting worse, and is not relieved by your pain medication, muscle relaxers, rest, and warm compresses  Bleeding, redness, swelling, pain, or drainage from surgical incision  Chills or flu-like symptoms  Fever greater than 101.0 F (38.3 C)  Inability to eat, drink fluids, or take medications  Problems with bowel or bladder functions  Difficulty breathing or shortness of breath  Warmth, tenderness, or  swelling in your calf  Contact Information  During office hours (Monday-Friday 9 am to 5 pm), please call your physician at [phone] and ask for Berdine Addison  After hours and weekends, please call the [facility] [facility] Operator at [phone] and ask for the Neurosurgery Resident On Call   For a life-threatening emergency, call 911

## 2019-02-01 NOTE — H&P (Signed)
I have reviewed and confirmed my history and physical from 01/06/2019 with no additions or changes. Plan for C6-7 arthroplasty.  Risks and benefits reviewed.  Heart sounds normal no MRG. Chest Clear to Auscultation Bilaterally.

## 2019-02-02 ENCOUNTER — Encounter: Payer: Self-pay | Admitting: Neurosurgery

## 2019-02-02 DIAGNOSIS — M5412 Radiculopathy, cervical region: Secondary | ICD-10-CM | POA: Diagnosis not present

## 2019-02-02 DIAGNOSIS — G43909 Migraine, unspecified, not intractable, without status migrainosus: Secondary | ICD-10-CM | POA: Diagnosis not present

## 2019-02-02 DIAGNOSIS — M50123 Cervical disc disorder at C6-C7 level with radiculopathy: Secondary | ICD-10-CM | POA: Diagnosis not present

## 2019-02-02 MED ORDER — IBUPROFEN 800 MG PO TABS: 800.0000 mg | ORAL_TABLET | Freq: Three times a day (TID) | ORAL | 0 refills | Status: DC | PRN

## 2019-02-02 NOTE — Plan of Care (Signed)
  Problem: Clinical Measurements: Goal: Ability to maintain clinical measurements within normal limits will improve Outcome: Progressing Goal: Will remain free from infection Outcome: Progressing Goal: Diagnostic test results will improve Outcome: Progressing Goal: Respiratory complications will improve Outcome: Progressing Goal: Cardiovascular complication will be avoided Outcome: Progressing   Problem: Activity: Goal: Risk for activity intolerance will decrease Outcome: Progressing   Problem: Pain Managment: Goal: General experience of comfort will improve Outcome: Progressing   Problem: Safety: Goal: Ability to remain free from injury will improve Outcome: Progressing   Problem: Skin Integrity: Goal: Risk for impaired skin integrity will decrease Outcome: Progressing   Problem: Elimination: Goal: Will not experience complications related to bowel motility Outcome: Progressing Goal: Will not experience complications related to urinary retention Outcome: Progressing

## 2019-02-02 NOTE — Anesthesia Postprocedure Evaluation (Signed)
Anesthesia Post Note  Patient: Amanda Mooney  Procedure(s) Performed: CERVICAL ANTERIOR DISC ARTHROPLASTY  C6-7 (N/A )  Patient location during evaluation: PACU Anesthesia Type: General Level of consciousness: awake and alert and oriented Pain management: pain level controlled Vital Signs Assessment: post-procedure vital signs reviewed and stable Respiratory status: spontaneous breathing, nonlabored ventilation and respiratory function stable Cardiovascular status: blood pressure returned to baseline and stable Postop Assessment: no signs of nausea or vomiting Anesthetic complications: no     Last Vitals:  Vitals:   02/02/19 0336 02/02/19 0738  BP: 121/79 107/71  Pulse: 76 95  Resp: 18 18  Temp: (!) 36.4 C 36.8 C  SpO2: 99% 100%    Last Pain:  Vitals:   02/02/19 0738  TempSrc: Oral  PainSc:                  [name]

## 2019-02-02 NOTE — Progress Notes (Signed)
Went over pt d/c paper work no questions. Pt belongings were returned and pt was d/c

## 2019-03-03 DIAGNOSIS — M5412 Radiculopathy, cervical region: Secondary | ICD-10-CM | POA: Diagnosis not present

## 2019-04-07 DIAGNOSIS — M5412 Radiculopathy, cervical region: Secondary | ICD-10-CM | POA: Diagnosis not present

## 2019-04-08 DIAGNOSIS — M4722 Other spondylosis with radiculopathy, cervical region: Secondary | ICD-10-CM | POA: Diagnosis not present

## 2019-10-11 DIAGNOSIS — Z3202 Encounter for pregnancy test, result negative: Secondary | ICD-10-CM | POA: Diagnosis not present

## 2019-10-11 DIAGNOSIS — N926 Irregular menstruation, unspecified: Secondary | ICD-10-CM | POA: Diagnosis not present

## 2019-10-11 DIAGNOSIS — M545 Low back pain: Secondary | ICD-10-CM | POA: Diagnosis not present

## 2019-11-10 DIAGNOSIS — M4322 Fusion of spine, cervical region: Secondary | ICD-10-CM | POA: Diagnosis not present

## 2019-11-10 DIAGNOSIS — M5412 Radiculopathy, cervical region: Secondary | ICD-10-CM | POA: Diagnosis not present

## 2019-12-24 DIAGNOSIS — Z03818 Encounter for observation for suspected exposure to other biological agents ruled out: Secondary | ICD-10-CM | POA: Diagnosis not present

## 2020-08-25 NOTE — L&D Delivery Note (Signed)
Delivery Note Amanda Mooney is a [name] at 58w0dwho had a spontaneous delivery at 17:17 on 04/04/21 a viable female was delivered. I was called to the room because patient had the urge to push, on arrival baby had delivered with patient on hands and knees. APGAR 8, 9, weight 3544g (7lb13oz)  Admitted for induction of labor for AMA. Induced with cytotec, pitocin and AROM. Progressed normally. Pushed for 1 minute. Baby was delivered without difficulty. Delayed cord clamping for 60 seconds. Delivery of placenta was spontaneous. Placenta was found to be intact, 3-vessel cord was noted. The fundus was found to be firm. 1st degree perineal laceration was repaired in the normal sterile fashion with 2-0 vicry, 10cc local lidocaine was used. Estimated blood loss 50cc. Instrument and gauze counts were correct at the end of the procedure.   Placenta status: L&D Mom to postpartum.  Baby to Couplet care / Skin to Skin.  Corry Storie K Taam-Akelman 04/04/2021, 5:55 PM

## 2020-09-07 DIAGNOSIS — O26849 Uterine size-date discrepancy, unspecified trimester: Secondary | ICD-10-CM | POA: Diagnosis not present

## 2020-09-07 DIAGNOSIS — N925 Other specified irregular menstruation: Secondary | ICD-10-CM | POA: Diagnosis not present

## 2020-09-07 DIAGNOSIS — Z3201 Encounter for pregnancy test, result positive: Secondary | ICD-10-CM | POA: Diagnosis not present

## 2020-09-07 DIAGNOSIS — R519 Headache, unspecified: Secondary | ICD-10-CM | POA: Diagnosis not present

## 2020-09-21 DIAGNOSIS — O09521 Supervision of elderly multigravida, first trimester: Secondary | ICD-10-CM | POA: Diagnosis not present

## 2020-09-21 DIAGNOSIS — B373 Candidiasis of vulva and vagina: Secondary | ICD-10-CM | POA: Diagnosis not present

## 2020-09-21 DIAGNOSIS — Z113 Encounter for screening for infections with a predominantly sexual mode of transmission: Secondary | ICD-10-CM | POA: Diagnosis not present

## 2020-09-21 DIAGNOSIS — Z124 Encounter for screening for malignant neoplasm of cervix: Secondary | ICD-10-CM | POA: Diagnosis not present

## 2020-09-21 DIAGNOSIS — Z3480 Encounter for supervision of other normal pregnancy, unspecified trimester: Secondary | ICD-10-CM | POA: Diagnosis not present

## 2020-09-21 LAB — OB RESULTS CONSOLE HIV ANTIBODY (ROUTINE TESTING): HIV: NONREACTIVE

## 2020-09-21 LAB — OB RESULTS CONSOLE ANTIBODY SCREEN: Antibody Screen: NEGATIVE

## 2020-09-21 LAB — OB RESULTS CONSOLE ABO/RH: RH Type: POSITIVE

## 2020-09-21 LAB — OB RESULTS CONSOLE RPR: RPR: NONREACTIVE

## 2020-09-21 LAB — OB RESULTS CONSOLE GC/CHLAMYDIA
Chlamydia: NEGATIVE
Gonorrhea: NEGATIVE

## 2020-09-21 LAB — OB RESULTS CONSOLE HEPATITIS B SURFACE ANTIGEN: Hepatitis B Surface Ag: NEGATIVE

## 2020-09-21 LAB — OB RESULTS CONSOLE RUBELLA ANTIBODY, IGM: Rubella: IMMUNE

## 2020-09-24 ENCOUNTER — Other Ambulatory Visit: Payer: Self-pay | Admitting: Obstetrics and Gynecology

## 2020-09-24 DIAGNOSIS — Z3686 Encounter for antenatal screening for cervical length: Secondary | ICD-10-CM

## 2020-09-25 DIAGNOSIS — Z87898 Personal history of other specified conditions: Secondary | ICD-10-CM

## 2020-09-25 HISTORY — DX: Personal history of other specified conditions: Z87.898

## 2020-10-05 DIAGNOSIS — T8859XA Other complications of anesthesia, initial encounter: Secondary | ICD-10-CM | POA: Insufficient documentation

## 2020-10-05 DIAGNOSIS — A749 Chlamydial infection, unspecified: Secondary | ICD-10-CM | POA: Insufficient documentation

## 2020-10-05 DIAGNOSIS — R000 Tachycardia, unspecified: Secondary | ICD-10-CM | POA: Insufficient documentation

## 2020-10-08 ENCOUNTER — Ambulatory Visit (INDEPENDENT_AMBULATORY_CARE_PROVIDER_SITE_OTHER): Payer: BC Managed Care – PPO

## 2020-10-08 ENCOUNTER — Other Ambulatory Visit: Payer: Self-pay

## 2020-10-08 ENCOUNTER — Encounter: Payer: Self-pay | Admitting: Cardiology

## 2020-10-08 ENCOUNTER — Ambulatory Visit: Payer: BC Managed Care – PPO

## 2020-10-08 ENCOUNTER — Ambulatory Visit: Payer: BC Managed Care – PPO | Admitting: Cardiology

## 2020-10-08 VITALS — BP 120/62 | HR 110 | Ht 61.0 in | Wt 159.0 lb

## 2020-10-08 DIAGNOSIS — I471 Supraventricular tachycardia: Secondary | ICD-10-CM

## 2020-10-08 DIAGNOSIS — R55 Syncope and collapse: Secondary | ICD-10-CM

## 2020-10-08 MED ORDER — PROPRANOLOL HCL 20 MG PO TABS: ORAL_TABLET | ORAL | 3 refills | Status: DC

## 2020-10-08 NOTE — Progress Notes (Signed)
Cardiology Office Note:    Date:  10/08/2020   ID:  Amanda Mooney 1981/06/18, MRN [id number]  PCP:  London Pepper, MD  Cardiologist:  Berniece Salines, DO  Electrophysiologist:  None   Referring MD: Rowland Lathe, MD    " I have been having episodes of lightheadedness dizziness and feeling like I am going to pass out"  History of Present Illness:    Amanda Mooney is a 40 y.o. female with a hx of   paroxysmal SVT which was diagnosed about 10 to 15 years ago, at that time the patient tells me that she was considered for ablation but first she was giving some metoprolol which helped in ablation was not perceived.  Over this year she is taking metoprolol and propanolol.  She is currently pregnant and has had 7 pregnancies and 3 live births of which was premature, 2 miscarriages and 2 abortions.  Her current pregnancy is 13 weeks and she was referred by her OB/GYN giving her syncope episodes in previous pregnancies and near syncope episodes she is experiencing now.  She tells me for her last 2 pregnancies she has had multiple syncope episodes with the most recent pregnancy in her 27-year-old when she had 2 syncope episodes most of which she feels lightheaded, some palpitations dizziness and most times if she does not lie flat she would pass out.  Recently she has been able to control the syncope episodes by just lying flat when she experiences the symptoms.  In 2017 the patient did wear a event monitor which did not show any evidence of SVT.  Past Medical History:  Diagnosis Date  . Abnormal Pap smear   . Anemia   . Chlamydia   . Complication of anesthesia    hard to arrouse after "twilight" anesthesia  . Migraine   . Tachycardia     Past Surgical History:  Procedure Laterality Date  . CERVICAL DISC ARTHROPLASTY N/A 02/01/2019   Procedure: CERVICAL ANTERIOR DISC ARTHROPLASTY  C6-7;  Surgeon: Meade Maw, MD;  Location: [facility] ORS;  Service: Neurosurgery;  Laterality: N/A;   . COLPOSCOPY    . HERNIA REPAIR      Current Medications: Current Meds  Medication Sig  . Prenatal Vit-DSS-Fe Cbn-FA (PRENATAL AD PO) Take 1 tablet by mouth daily at 6 (six) AM.  . propranolol (INDERAL) 20 MG tablet Take one tablet by mouth once daily as needed for palpations or heart rate greater than 130     Allergies:   Patient has no known allergies.   Social History   Socioeconomic History  . Marital status: Married    Spouse name: Not on file  . Number of children: Not on file  . Years of education: Not on file  . Highest education level: Not on file  Occupational History  . Not on file  Tobacco Use  . Smoking status: Never Smoker  . Smokeless tobacco: Never Used  Vaping Use  . Vaping Use: Never used  Substance and Sexual Activity  . Alcohol use: No  . Drug use: No  . Sexual activity: Yes    Birth control/protection: None  Other Topics Concern  . Not on file  Social History Narrative   ** Merged History Encounter **       Social Determinants of Health   Financial Resource Strain: Not on file  Food Insecurity: Not on file  Transportation Needs: Not on file  Physical Activity: Not on file  Stress: Not on file  Social Connections: Not on file     Family History: The patient's family history includes Cancer - Prostate in her maternal grandfather; Cirrhosis in her paternal grandfather; Diabetes in her mother; Healthy in her brother, daughter, daughter, maternal grandmother, and son; Heart Problems in her maternal grandfather; Heart attack in her paternal grandmother; Hypertension in her father and mother.  ROS:   Review of Systems  Constitution: Negative for decreased appetite, fever and weight gain.  HENT: Negative for congestion, ear discharge, hoarse voice and sore throat.   Eyes: Negative for discharge, redness, vision loss in right eye and visual halos.  Cardiovascular: Reports palpitations, history of syncope, recent presyncope episodes.  Negative for  chest pain, dyspnea on exertion, leg swelling, orthopnea.Marland Kitchen  Respiratory: Negative for cough, hemoptysis, shortness of breath and snoring.   Endocrine: Negative for heat intolerance and polyphagia.  Hematologic/Lymphatic: Negative for bleeding problem. Does not bruise/bleed easily.  Skin: Negative for flushing, nail changes, rash and suspicious lesions.  Musculoskeletal: Negative for arthritis, joint pain, muscle cramps, myalgias, neck pain and stiffness.  Gastrointestinal: Negative for abdominal pain, bowel incontinence, diarrhea and excessive appetite.  Genitourinary: Negative for decreased libido, genital sores and incomplete emptying.  Neurological: Negative for brief paralysis, focal weakness, headaches and loss of balance.  Psychiatric/Behavioral: Negative for altered mental status, depression and suicidal ideas.  Allergic/Immunologic: Negative for HIV exposure and persistent infections.    EKGs/Labs/Other Studies Reviewed:    The following studies were reviewed today:   EKG:  The ekg ordered today demonstrates sinus tachycardia, heart rate 110 bpm  Recent Labs: No results found for requested labs within last 8760 hours.  Recent Lipid Panel No results found for: CHOL, TRIG, HDL, CHOLHDL, VLDL, LDLCALC, LDLDIRECT  Physical Exam:    VS:  BP 120/62 (BP Location: Right Arm)   Pulse (!) 110   Ht 5\' 1"  (1.549 m)   Wt 159 lb (72.1 kg)   SpO2 99%   BMI 30.04 kg/m     Wt Readings from Last 3 Encounters:  10/08/20 159 lb (72.1 kg)  02/01/19 144 lb (65.3 kg)  01/28/19 144 lb (65.3 kg)     GEN: Well nourished, well developed in no acute distress HEENT: Normal NECK: No JVD; No carotid bruits LYMPHATICS: No lymphadenopathy CARDIAC: S1S2 noted,RRR, 2/6 systolic murmurs, rubs, gallops RESPIRATORY:  Clear to auscultation without rales, wheezing or rhonchi  ABDOMEN: Soft, non-tender, non-distended, +bowel sounds, no guarding. EXTREMITIES: No edema, No cyanosis, no  clubbing MUSCULOSKELETAL:  No deformity  SKIN: Warm and dry NEUROLOGIC:  Alert and oriented x 3, non-focal PSYCHIATRIC:  Normal affect, good insight  ASSESSMENT:    1. Syncope and collapse   2. SVT (supraventricular tachycardia) (HCC)    PLAN:     Her syncope episodes is concerning she does also have a 2/6 systolic murmur.  While systolic murmurs are normally pregnancy but giving her recurrent syncope and presyncope episodes I like to get an echocardiogram to assess for any structural abnormalities.  She has known history of supraventricular tachycardia she has not been on any medications in a while since 2017 she has been off of metoprolol.  Before I placed the patient back on low-dose beta-blocker like to understand if she is experiencing any significant SVT burden and make sure that other arrhythmias or not playing a role here.  In the meantime I will give her propanolol 20 mg once daily as needed if she gets any episodes of palpitations that she may not be able to tolerate.  I discussed with the patient in great detail about the above plan and she is agreeable.  The patient is in agreement with the above plan. The patient left the office in stable condition.  The patient will follow up in 4 weeks or sooner if needed.   Medication Adjustments/Labs and Tests Ordered: Current medicines are reviewed at length with the patient today.  Concerns regarding medicines are outlined above.  Orders Placed This Encounter  Procedures  . LONG TERM MONITOR (3-14 DAYS)  . LONG TERM MONITOR-LIVE TELEMETRY (3-14 DAYS)  . EKG 12-Lead  . ECHOCARDIOGRAM COMPLETE   Meds ordered this encounter  Medications  . propranolol (INDERAL) 20 MG tablet    Sig: Take one tablet by mouth once daily as needed for palpations or heart rate greater than 130    Dispense:  90 tablet    Refill:  3    Patient Instructions   Medication Instructions:  Your physician has recommended you make the following change in  your medication: START: Propranolol 20 mg once daily as needed for palpations or heart rate greater then 130  *If you need a refill on your cardiac medications before your next appointment, please call your pharmacy*   Lab Work: None If you have labs (blood work) drawn today and your tests are completely normal, you will receive your results only by: Marland Kitchen MyChart Message (if you have MyChart) OR . A paper copy in the mail If you have any lab test that is abnormal or we need to change your treatment, we will call you to review the results.   Testing/Procedures: Your physician has requested that you have an echocardiogram. Echocardiography is a painless test that uses sound waves to create images of your heart. It provides your doctor with information about the size and shape of your heart and how well your heart's chambers and valves are working. This procedure takes approximately one hour. There are no restrictions for this procedure.  A zio monitor was ordered today. It will remain on for 14 days. You will then return monitor and event diary in provided box. It takes 1-2 weeks for report to be downloaded and returned to [place]. We will call you with the results. If monitor falls off or has orange flashing light, please call Zio for further instructions.     Follow-Up: At [facility], you and your health needs are our priority.  As part of our continuing mission to provide you with exceptional heart care, we have created designated Provider Care Teams.  These Care Teams include your primary Cardiologist (physician) and Advanced Practice Providers (APPs -  Physician Assistants and Nurse Practitioners) who all work together to provide you with the care you need, when you need it.  We recommend signing up for the patient portal called "MyChart".  Sign up information is provided on this After Visit Summary.  MyChart is used to connect with patients for Virtual Visits (Telemedicine).  Patients are  able to view lab/test results, encounter notes, upcoming appointments, etc.  Non-urgent messages can be sent to your provider as well.   To learn more about what you can do with MyChart, go to [email or web address].    Your next appointment:   4 week(s)  The format for your next appointment:   In Person  Provider:   Berniece Salines, DO   Other Instructions  Echocardiogram An echocardiogram is a test that uses sound waves (ultrasound) to produce images of the heart. Images from an echocardiogram can provide  important information about:  Heart size and shape.  The size and thickness and movement of your heart's walls.  Heart muscle function and strength.  Heart valve function or if you have stenosis. Stenosis is when the heart valves are too narrow.  If blood is flowing backward through the heart valves (regurgitation).  A tumor or infectious growth around the heart valves.  Areas of heart muscle that are not working well because of poor blood flow or injury from a heart attack.  Aneurysm detection. An aneurysm is a weak or damaged part of an artery wall. The wall bulges out from the normal force of blood pumping through the body. Tell a health care provider about:  Any allergies you have.  All medicines you are taking, including vitamins, herbs, eye drops, creams, and over-the-counter medicines.  Any blood disorders you have.  Any surgeries you have had.  Any medical conditions you have.  Whether you are pregnant or may be pregnant. What are the risks? Generally, this is a safe test. However, problems may occur, including an allergic reaction to dye (contrast) that may be used during the test. What happens before the test? No specific preparation is needed. You may eat and drink normally. What happens during the test?  You will take off your clothes from the waist up and put on a hospital gown.  Electrodes or electrocardiogram (ECG)patches may be placed on your  chest. The electrodes or patches are then connected to a device that monitors your heart rate and rhythm.  You will lie down on a table for an ultrasound exam. A gel will be applied to your chest to help sound waves pass through your skin.  A handheld device, called a transducer, will be pressed against your chest and moved over your heart. The transducer produces sound waves that travel to your heart and bounce back (or "echo" back) to the transducer. These sound waves will be captured in real-time and changed into images of your heart that can be viewed on a video monitor. The images will be recorded on a computer and reviewed by your health care provider.  You may be asked to change positions or hold your breath for a short time. This makes it easier to get different views or better views of your heart.  In some cases, you may receive contrast through an IV in one of your veins. This can improve the quality of the pictures from your heart. The procedure may vary among health care providers and hospitals.   What can I expect after the test? You may return to your normal, everyday life, including diet, activities, and medicines, unless your health care provider tells you not to do that. Follow these instructions at home:  It is up to you to get the results of your test. Ask your health care provider, or the department that is doing the test, when your results will be ready.  Keep all follow-up visits. This is important. Summary  An echocardiogram is a test that uses sound waves (ultrasound) to produce images of the heart.  Images from an echocardiogram can provide important information about the size and shape of your heart, heart muscle function, heart valve function, and other possible heart problems.  You do not need to do anything to prepare before this test. You may eat and drink normally.  After the echocardiogram is completed, you may return to your normal, everyday life, unless your  health care provider tells you not to do  that. This information is not intended to replace advice given to you by your health care provider. Make sure you discuss any questions you have with your health care provider. Document Revised: 04/03/2020 Document Reviewed: 04/03/2020 Elsevier Patient Education  2021 [place].      Adopting a Healthy Lifestyle.  Know what a healthy weight is for you (roughly BMI <25) and aim to maintain this   Aim for 7+ servings of fruits and vegetables daily   65-80+ fluid ounces of water or unsweet tea for healthy kidneys   Limit to max 1 drink of alcohol per day; avoid smoking/tobacco   Limit animal fats in diet for cholesterol and heart health - choose grass fed whenever available   Avoid highly processed foods, and foods high in saturated/trans fats   Aim for low stress - take time to unwind and care for your mental health   Aim for 150 min of moderate intensity exercise weekly for heart health, and weights twice weekly for bone health   Aim for 7-9 hours of sleep daily   When it comes to diets, agreement about the perfect plan isnt easy to find, even among the experts. Experts at the [employer] developed an idea known as the Healthy Eating Plate. Just imagine a plate divided into logical, healthy portions.   The emphasis is on diet quality:   Load up on vegetables and fruits - one-half of your plate: Aim for color and variety, and remember that potatoes dont count.   Go for whole grains - one-quarter of your plate: Whole wheat, barley, wheat berries, quinoa, oats, brown rice, and foods made with them. If you want pasta, go with whole wheat pasta.   Protein power - one-quarter of your plate: Fish, chicken, beans, and nuts are all healthy, versatile protein sources. Limit red meat.   The diet, however, does go beyond the plate, offering a few other suggestions.   Use healthy plant oils, such as olive, canola, soy,  corn, sunflower and peanut. Check the labels, and avoid partially hydrogenated oil, which have unhealthy trans fats.   If youre thirsty, drink water. Coffee and tea are good in moderation, but skip sugary drinks and limit milk and dairy products to one or two daily servings.   The type of carbohydrate in the diet is more important than the amount. Some sources of carbohydrates, such as vegetables, fruits, whole grains, and beans-are healthier than others.   Finally, stay active  Signed, Berniece Salines, DO  10/08/2020 5:01 PM    [facility]

## 2020-10-08 NOTE — Patient Instructions (Addendum)
Medication Instructions:  Your physician has recommended you make the following change in your medication: START: Propranolol 20 mg once daily as needed for palpations or heart rate greater then 130  *If you need a refill on your cardiac medications before your next appointment, please call your pharmacy*   Lab Work: None If you have labs (blood work) drawn today and your tests are completely normal, you will receive your results only by: Marland Kitchen MyChart Message (if you have MyChart) OR . A paper copy in the mail If you have any lab test that is abnormal or we need to change your treatment, we will call you to review the results.   Testing/Procedures: Your physician has requested that you have an echocardiogram. Echocardiography is a painless test that uses sound waves to create images of your heart. It provides your doctor with information about the size and shape of your heart and how well your heart's chambers and valves are working. This procedure takes approximately one hour. There are no restrictions for this procedure.  A zio monitor was ordered today. It will remain on for 14 days. You will then return monitor and event diary in provided box. It takes 1-2 weeks for report to be downloaded and returned to [place]. We will call you with the results. If monitor falls off or has orange flashing light, please call Zio for further instructions.     Follow-Up: At [facility], you and your health needs are our priority.  As part of our continuing mission to provide you with exceptional heart care, we have created designated Provider Care Teams.  These Care Teams include your primary Cardiologist (physician) and Advanced Practice Providers (APPs -  Physician Assistants and Nurse Practitioners) who all work together to provide you with the care you need, when you need it.  We recommend signing up for the patient portal called "MyChart".  Sign up information is provided on this After Visit Summary.   MyChart is used to connect with patients for Virtual Visits (Telemedicine).  Patients are able to view lab/test results, encounter notes, upcoming appointments, etc.  Non-urgent messages can be sent to your provider as well.   To learn more about what you can do with MyChart, go to [email or web address].    Your next appointment:   4 week(s)  The format for your next appointment:   In Person  Provider:   Berniece Salines, DO   Other Instructions  Echocardiogram An echocardiogram is a test that uses sound waves (ultrasound) to produce images of the heart. Images from an echocardiogram can provide important information about:  Heart size and shape.  The size and thickness and movement of your heart's walls.  Heart muscle function and strength.  Heart valve function or if you have stenosis. Stenosis is when the heart valves are too narrow.  If blood is flowing backward through the heart valves (regurgitation).  A tumor or infectious growth around the heart valves.  Areas of heart muscle that are not working well because of poor blood flow or injury from a heart attack.  Aneurysm detection. An aneurysm is a weak or damaged part of an artery wall. The wall bulges out from the normal force of blood pumping through the body. Tell a health care provider about:  Any allergies you have.  All medicines you are taking, including vitamins, herbs, eye drops, creams, and over-the-counter medicines.  Any blood disorders you have.  Any surgeries you have had.  Any medical conditions you have.  Whether you are pregnant or may be pregnant. What are the risks? Generally, this is a safe test. However, problems may occur, including an allergic reaction to dye (contrast) that may be used during the test. What happens before the test? No specific preparation is needed. You may eat and drink normally. What happens during the test?  You will take off your clothes from the waist up and put on  a hospital gown.  Electrodes or electrocardiogram (ECG)patches may be placed on your chest. The electrodes or patches are then connected to a device that monitors your heart rate and rhythm.  You will lie down on a table for an ultrasound exam. A gel will be applied to your chest to help sound waves pass through your skin.  A handheld device, called a transducer, will be pressed against your chest and moved over your heart. The transducer produces sound waves that travel to your heart and bounce back (or "echo" back) to the transducer. These sound waves will be captured in real-time and changed into images of your heart that can be viewed on a video monitor. The images will be recorded on a computer and reviewed by your health care provider.  You may be asked to change positions or hold your breath for a short time. This makes it easier to get different views or better views of your heart.  In some cases, you may receive contrast through an IV in one of your veins. This can improve the quality of the pictures from your heart. The procedure may vary among health care providers and hospitals.   What can I expect after the test? You may return to your normal, everyday life, including diet, activities, and medicines, unless your health care provider tells you not to do that. Follow these instructions at home:  It is up to you to get the results of your test. Ask your health care provider, or the department that is doing the test, when your results will be ready.  Keep all follow-up visits. This is important. Summary  An echocardiogram is a test that uses sound waves (ultrasound) to produce images of the heart.  Images from an echocardiogram can provide important information about the size and shape of your heart, heart muscle function, heart valve function, and other possible heart problems.  You do not need to do anything to prepare before this test. You may eat and drink normally.  After the  echocardiogram is completed, you may return to your normal, everyday life, unless your health care provider tells you not to do that. This information is not intended to replace advice given to you by your health care provider. Make sure you discuss any questions you have with your health care provider. Document Revised: 04/03/2020 Document Reviewed: 04/03/2020 Elsevier Patient Education  2021 [employer].

## 2020-10-17 ENCOUNTER — Encounter: Payer: Self-pay | Admitting: *Deleted

## 2020-10-19 DIAGNOSIS — Z3482 Encounter for supervision of other normal pregnancy, second trimester: Secondary | ICD-10-CM | POA: Diagnosis not present

## 2020-10-19 DIAGNOSIS — Z369 Encounter for antenatal screening, unspecified: Secondary | ICD-10-CM | POA: Diagnosis not present

## 2020-10-19 DIAGNOSIS — R5383 Other fatigue: Secondary | ICD-10-CM | POA: Diagnosis not present

## 2020-10-25 ENCOUNTER — Encounter: Payer: Self-pay | Admitting: *Deleted

## 2020-10-25 ENCOUNTER — Ambulatory Visit: Payer: BC Managed Care – PPO | Admitting: *Deleted

## 2020-10-25 ENCOUNTER — Ambulatory Visit: Payer: BC Managed Care – PPO | Attending: Obstetrics and Gynecology

## 2020-10-25 ENCOUNTER — Other Ambulatory Visit: Payer: Self-pay

## 2020-10-25 VITALS — BP 126/72 | HR 97

## 2020-10-25 DIAGNOSIS — O09529 Supervision of elderly multigravida, unspecified trimester: Secondary | ICD-10-CM | POA: Diagnosis not present

## 2020-10-25 DIAGNOSIS — O09212 Supervision of pregnancy with history of pre-term labor, second trimester: Secondary | ICD-10-CM | POA: Diagnosis not present

## 2020-10-25 DIAGNOSIS — O09522 Supervision of elderly multigravida, second trimester: Secondary | ICD-10-CM

## 2020-10-25 DIAGNOSIS — O99412 Diseases of the circulatory system complicating pregnancy, second trimester: Secondary | ICD-10-CM

## 2020-10-25 DIAGNOSIS — Z3686 Encounter for antenatal screening for cervical length: Secondary | ICD-10-CM | POA: Diagnosis not present

## 2020-10-25 DIAGNOSIS — E669 Obesity, unspecified: Secondary | ICD-10-CM

## 2020-10-25 DIAGNOSIS — Z3A16 16 weeks gestation of pregnancy: Secondary | ICD-10-CM | POA: Diagnosis not present

## 2020-10-25 DIAGNOSIS — I471 Supraventricular tachycardia: Secondary | ICD-10-CM

## 2020-10-26 ENCOUNTER — Other Ambulatory Visit: Payer: Self-pay | Admitting: Obstetrics and Gynecology

## 2020-10-26 DIAGNOSIS — O09522 Supervision of elderly multigravida, second trimester: Secondary | ICD-10-CM

## 2020-10-26 DIAGNOSIS — I519 Heart disease, unspecified: Secondary | ICD-10-CM

## 2020-10-26 DIAGNOSIS — O09219 Supervision of pregnancy with history of pre-term labor, unspecified trimester: Secondary | ICD-10-CM

## 2020-10-26 DIAGNOSIS — O99412 Diseases of the circulatory system complicating pregnancy, second trimester: Secondary | ICD-10-CM

## 2020-11-02 ENCOUNTER — Other Ambulatory Visit: Payer: Self-pay

## 2020-11-02 ENCOUNTER — Ambulatory Visit (HOSPITAL_BASED_OUTPATIENT_CLINIC_OR_DEPARTMENT_OTHER)
Admission: RE | Admit: 2020-11-02 | Discharge: 2020-11-02 | Disposition: A | Discharge status: Home / Self Care | Payer: BC Managed Care – PPO | Source: Ambulatory Visit | Attending: Cardiology | Admitting: Cardiology

## 2020-11-02 DIAGNOSIS — R55 Syncope and collapse: Secondary | ICD-10-CM | POA: Diagnosis not present

## 2020-11-03 LAB — ECHOCARDIOGRAM COMPLETE
Area-P 1/2: 4.06 cm2
S' Lateral: 2.82 cm

## 2020-11-05 ENCOUNTER — Emergency Department
Admission: EM | Admit: 2020-11-05 | Discharge: 2020-11-05 | Disposition: A | Discharge status: Home / Self Care | Payer: BC Managed Care – PPO | Attending: Emergency Medicine | Admitting: Emergency Medicine

## 2020-11-05 ENCOUNTER — Other Ambulatory Visit: Payer: Self-pay

## 2020-11-05 ENCOUNTER — Encounter: Payer: Self-pay | Admitting: Emergency Medicine

## 2020-11-05 DIAGNOSIS — Z3A18 18 weeks gestation of pregnancy: Secondary | ICD-10-CM | POA: Insufficient documentation

## 2020-11-05 DIAGNOSIS — R1013 Epigastric pain: Secondary | ICD-10-CM | POA: Diagnosis not present

## 2020-11-05 DIAGNOSIS — O26892 Other specified pregnancy related conditions, second trimester: Secondary | ICD-10-CM | POA: Diagnosis not present

## 2020-11-05 LAB — URINALYSIS, COMPLETE (UACMP) WITH MICROSCOPIC
Bacteria, UA: NONE SEEN
Bilirubin Urine: NEGATIVE
Glucose, UA: NEGATIVE mg/dL
Hgb urine dipstick: NEGATIVE
Ketones, ur: 5 mg/dL — AB
Leukocytes,Ua: NEGATIVE
Nitrite: NEGATIVE
Protein, ur: NEGATIVE mg/dL
Specific Gravity, Urine: 1.027 (ref 1.005–1.030)
pH: 5 (ref 5.0–8.0)

## 2020-11-05 LAB — COMPREHENSIVE METABOLIC PANEL
ALT: 19 U/L (ref 0–44)
AST: 17 U/L (ref 15–41)
Albumin: 3.3 g/dL — ABNORMAL LOW (ref 3.5–5.0)
Alkaline Phosphatase: 45 U/L (ref 38–126)
Anion gap: 8 (ref 5–15)
BUN: 12 mg/dL (ref 6–20)
CO2: 23 mmol/L (ref 22–32)
Calcium: 8.6 mg/dL — ABNORMAL LOW (ref 8.9–10.3)
Chloride: 105 mmol/L (ref 98–111)
Creatinine, Ser: 0.45 mg/dL (ref 0.44–1.00)
GFR, Estimated: >60 mL/min (ref >60)
Glucose, Bld: 79 mg/dL (ref 70–99)
Potassium: 4 mmol/L (ref 3.5–5.1)
Sodium: 136 mmol/L (ref 135–145)
Total Bilirubin: 0.5 mg/dL (ref 0.3–1.2)
Total Protein: 6.8 g/dL (ref 6.5–8.1)

## 2020-11-05 LAB — TROPONIN I (HIGH SENSITIVITY): Troponin I (High Sensitivity): <2 ng/L (ref <18)

## 2020-11-05 LAB — CBC
HCT: 34.2 % — ABNORMAL LOW (ref 36.0–46.0)
Hemoglobin: 11 g/dL — ABNORMAL LOW (ref 12.0–15.0)
MCH: 27.6 pg (ref 26.0–34.0)
MCHC: 32.2 g/dL (ref 30.0–36.0)
MCV: 85.9 fL (ref 80.0–100.0)
Platelets: 222 10*3/uL (ref 150–400)
RBC: 3.98 MIL/uL (ref 3.87–5.11)
RDW: 22.1 % — ABNORMAL HIGH (ref 11.5–15.5)
WBC: 10.3 10*3/uL (ref 4.0–10.5)
nRBC: 0 % (ref 0.0–0.2)

## 2020-11-05 LAB — LIPASE, BLOOD: Lipase: 30 U/L (ref 11–51)

## 2020-11-05 NOTE — ED Triage Notes (Signed)
Pt comes into the ED via EMS from work with acute upper abd pain, pt is [redacted] weeks pregnant, states she has been having syncope and SVT and had a recent echo. Pt is a/ox4  HR90 140/80

## 2020-11-05 NOTE — ED Provider Notes (Signed)
[facility] Emergency Department Provider Note   ____________________________________________    I have reviewed the triage vital signs and the nursing notes.   HISTORY  Chief Complaint Abdominal Pain     HPI Amanda Mooney is a 40 y.o. female reports she is [redacted] weeks pregnant, this is her fourth pregnancy who presents with epigastric pain which has now resolved.  Patient reports that she was at work, developed sharp/burning pain in her epigastrium which was moderate to severe.  She reports by the time she got to the emergency department she is feeling better.  She has not had this pain before.  Denies a history of GERD.  No chest pain today, has been evaluated by cardiology for palpitations  Past Medical History:  Diagnosis Date  . Abnormal Pap smear   . Anemia   . Chlamydia   . Complication of anesthesia    hard to arrouse after "twilight" anesthesia  . Migraine   . Tachycardia     Patient Active Problem List   Diagnosis Date Noted  . Tachycardia   . Complication of anesthesia   . Chlamydia   . S/P cervical spinal fusion 02/01/2019  . Anemia 04/09/2017  . Anxiety 04/09/2017  . Migraine 04/09/2017  . SVT (supraventricular tachycardia) ([place]) 07/09/2016    Past Surgical History:  Procedure Laterality Date  . CERVICAL DISC ARTHROPLASTY N/A 02/01/2019   Procedure: CERVICAL ANTERIOR DISC ARTHROPLASTY  C6-7;  Surgeon: Meade Maw, MD;  Location: [facility] ORS;  Service: Neurosurgery;  Laterality: N/A;  . COLPOSCOPY    . HERNIA REPAIR      Prior to Admission medications   Medication Sig Start Date End Date Taking? Authorizing Provider  Prenatal Vit-DSS-Fe Cbn-FA (PRENATAL AD PO) Take 1 tablet by mouth daily at 6 (six) AM.    [provider]  propranolol (INDERAL) 20 MG tablet Take one tablet by mouth once daily as needed for palpations or heart rate greater than 130 10/08/20   Tobb, Kardie, DO     Allergies Patient has no known  allergies.  Family History  Problem Relation Age of Onset  . Diabetes Mother   . Hypertension Mother   . Hypertension Father   . Healthy Maternal Grandmother   . Cancer - Prostate Maternal Grandfather   . Heart Problems Maternal Grandfather   . Heart attack Paternal Grandmother   . Cirrhosis Paternal Grandfather   . Healthy Brother   . Healthy Daughter   . Healthy Daughter   . Healthy Son     Social History Social History   Tobacco Use  . Smoking status: Never Smoker  . Smokeless tobacco: Never Used  Vaping Use  . Vaping Use: Never used  Substance Use Topics  . Alcohol use: No  . Drug use: No    Review of Systems  Constitutional: No fever/chills Eyes: No visual changes.  ENT: No sore throat. Cardiovascular: As above Respiratory: Denies shortness of breath. Gastrointestinal: As above Genitourinary: Negative for dysuria. Musculoskeletal: Negative for back pain. Skin: Negative for rash. Neurological: Negative for headaches or weakness   ____________________________________________   PHYSICAL EXAM:  VITAL SIGNS: ED Triage Vitals  Enc Vitals Group     BP 11/05/20 1000 119/79     Pulse Rate 11/05/20 1000 93     Resp 11/05/20 1000 16     Temp 11/05/20 1000 98.2 F (36.8 C)     Temp Source 11/05/20 1000 Oral     SpO2 11/05/20 1000 99 %  Weight 11/05/20 1000 74.8 kg (165 lb)     Height 11/05/20 1000 1.549 m (5\' 1" )     Head Circumference --      Peak Flow --      Pain Score 11/05/20 1006 2     Pain Loc --      Pain Edu? --      Excl. in [place]? --     Constitutional: Alert and oriented. No acute distress. Pleasant and interactive Eyes: Conjunctivae are normal.   Mouth/Throat: Mucous membranes are moist.    Cardiovascular: Normal rate, regular rhythm. Grossly normal heart sounds.  Good peripheral circulation. Respiratory: Normal respiratory effort.  No retractions. Gastrointestinal: Soft and nontender.  Gravid appearance  Musculoskeletal: No lower  extremity tenderness nor edema.  Warm and well perfused Neurologic:  Normal speech and language. No gross focal neurologic deficits are appreciated.  Skin:  Skin is warm, dry and intact. No rash noted. Psychiatric: Mood and affect are normal. Speech and behavior are normal.  ____________________________________________   LABS (all labs ordered are listed, but only abnormal results are displayed)  Labs Reviewed  COMPREHENSIVE METABOLIC PANEL - Abnormal; Notable for the following components:      Result Value   Calcium 8.6 (*)    Albumin 3.3 (*)    All other components within normal limits  CBC - Abnormal; Notable for the following components:   Hemoglobin 11.0 (*)    HCT 34.2 (*)    RDW 22.1 (*)    All other components within normal limits  URINALYSIS, COMPLETE (UACMP) WITH MICROSCOPIC - Abnormal; Notable for the following components:   Color, Urine YELLOW (*)    APPearance HAZY (*)    Ketones, ur 5 (*)    All other components within normal limits  LIPASE, BLOOD  TROPONIN I (HIGH SENSITIVITY)   ____________________________________________  EKG  ED ECG REPORT I, Lavonia Drafts, the attending physician, personally viewed and interpreted this ECG.  Date: 11/05/2020  Rhythm: normal sinus rhythm QRS Axis: normal Intervals: normal ST/T Wave abnormalities: normal Narrative Interpretation: no evidence of acute ischemia  ____________________________________________  RADIOLOGY  None ____________________________________________   PROCEDURES  Procedure(s) performed: No  Procedures   Critical Care performed: No ____________________________________________   INITIAL IMPRESSION / ASSESSMENT AND PLAN / ED COURSE  Pertinent labs & imaging results that were available during my care of the patient were reviewed by me and considered in my medical decision making (see chart for details).  Patient well-appearing and asymptomatic here in the emergency department, she is  feeling well and has no complaints.  Epigastric pain which lasted for less than an hour, suspicious for esophagitis/GERD/gastritis,  Given recent cardiology work-up high sensitive troponin and EKG checked which were reassuring.  Lipase is normal, LFTs are normal.  White blood cell count is normal.  No recurrence of pain in the emergency department, appropriate for discharge with close outpatient follow-up as needed, return precautions discussed    ____________________________________________   FINAL CLINICAL IMPRESSION(S) / ED DIAGNOSES  Final diagnoses:  Epigastric pain        Note:  This document was prepared using Dragon voice recognition software and may include unintentional dictation errors.   Lavonia Drafts, MD 11/05/20 1452

## 2020-11-05 NOTE — ED Notes (Signed)
MD at bedside. 

## 2020-11-08 ENCOUNTER — Telehealth: Payer: Self-pay

## 2020-11-08 ENCOUNTER — Ambulatory Visit: Payer: BC Managed Care – PPO

## 2020-11-08 ENCOUNTER — Other Ambulatory Visit: Payer: BC Managed Care – PPO

## 2020-11-08 NOTE — Telephone Encounter (Signed)
-----   Message from Gita Kudo, RN sent at 11/05/2020  7:54 AM EDT -----  ----- Message ----- From: Berniece Salines, DO Sent: 11/02/2020   8:35 PM EDT To: C[facility]/Hp Triage  Normal unremarkable study with your monitor.

## 2020-11-08 NOTE — Telephone Encounter (Signed)
Tried calling patient. No answer and no voicemail set up for me to leave a message. 

## 2020-11-09 ENCOUNTER — Telehealth: Payer: Self-pay

## 2020-11-09 NOTE — Telephone Encounter (Signed)
-----   Message from Gita Kudo, RN sent at 11/05/2020  7:54 AM EDT -----  ----- Message ----- From: Berniece Salines, DO Sent: 11/02/2020   8:35 PM EDT To: C[facility]/Hp Triage  Normal unremarkable study with your monitor.

## 2020-11-09 NOTE — Telephone Encounter (Signed)
Tried calling patient. No answer and no voicemail set up for me to leave a message. 

## 2020-11-12 NOTE — Telephone Encounter (Signed)
patient called back for results info

## 2020-11-12 NOTE — Telephone Encounter (Signed)
Tried calling patient. No answer and no voicemail set up for me to leave a message. 

## 2020-11-12 NOTE — Telephone Encounter (Signed)
Spoke with patient regarding results and recommendation.  Patient verbalizes understanding and is agreeable to plan of care. Advised patient to call back with any issues or concerns.  

## 2020-11-13 DIAGNOSIS — N926 Irregular menstruation, unspecified: Secondary | ICD-10-CM | POA: Insufficient documentation

## 2020-11-13 DIAGNOSIS — M5412 Radiculopathy, cervical region: Secondary | ICD-10-CM | POA: Insufficient documentation

## 2020-11-14 ENCOUNTER — Other Ambulatory Visit: Payer: Self-pay

## 2020-11-14 ENCOUNTER — Ambulatory Visit: Payer: BC Managed Care – PPO | Admitting: Cardiology

## 2020-11-14 ENCOUNTER — Encounter: Payer: Self-pay | Admitting: Cardiology

## 2020-11-14 VITALS — BP 110/70 | HR 96 | Ht 61.0 in | Wt 165.0 lb

## 2020-11-14 DIAGNOSIS — R42 Dizziness and giddiness: Secondary | ICD-10-CM | POA: Insufficient documentation

## 2020-11-14 DIAGNOSIS — I471 Supraventricular tachycardia: Secondary | ICD-10-CM | POA: Diagnosis not present

## 2020-11-14 DIAGNOSIS — E669 Obesity, unspecified: Secondary | ICD-10-CM | POA: Diagnosis not present

## 2020-11-14 DIAGNOSIS — R55 Syncope and collapse: Secondary | ICD-10-CM | POA: Diagnosis not present

## 2020-11-14 NOTE — Progress Notes (Signed)
Cardiology Office Note:    Date:  11/14/2020   ID:  Amanda Mooney, Amanda Mooney August 19, 1981, MRN [id number]  PCP:  London Pepper, MD  Cardiologist:  Berniece Salines, DO  Electrophysiologist:  None   Referring MD: London Pepper, MD   I am getting some relief with the propanolol but I am still having intermittent symptoms  History of Present Illness:    Amanda Mooney is a 40 y.o. female with a hx of paroxysmal SVT which was diagnosed about 10 to 15 years ago, at that time the patient tells me that she was considered for ablation but first she was giving some metoprolol which helped in ablation was not pursued.    Of note did tell me at her initial visit that her last 2 pregnancies she has had multiple syncope episodes with the most recent pregnancy in her 59-year-old when she had 2 syncope episodes most of which she feels lightheaded, some palpitations dizziness and most times if she does not lie flat she would pass out.  Recently she has been able to control the syncope episodes by just lying flat when she experiences the symptoms.  During her visit given her palpitations and history of SVT I placed a monitor on the patient as well as get an echocardiogram.  Since I last saw the patient she was seen in the emergency department for discomfort which was noted to be GERD.  The patient is here today for cardio obstetrics evaluation.  She is [id number] and currently 18 weeks 6 days pregnant  Past Medical History:  Diagnosis Date  . Abnormal Pap smear   . Anemia   . Chlamydia   . Complication of anesthesia    hard to arrouse after "twilight" anesthesia  . Migraine   . Tachycardia     Past Surgical History:  Procedure Laterality Date  . CERVICAL DISC ARTHROPLASTY N/A 02/01/2019   Procedure: CERVICAL ANTERIOR DISC ARTHROPLASTY  C6-7;  Surgeon: Meade Maw, MD;  Location: [facility] ORS;  Service: Neurosurgery;  Laterality: N/A;  . COLPOSCOPY    . HERNIA REPAIR      Current Medications: Current Meds   Medication Sig  . Prenatal Vit-DSS-Fe Cbn-FA (PRENATAL AD PO) Take 1 tablet by mouth daily at 6 (six) AM.  . propranolol (INDERAL) 20 MG tablet Take one tablet by mouth once daily as needed for palpations or heart rate greater than 130     Allergies:   Patient has no known allergies.   Social History   Socioeconomic History  . Marital status: Married    Spouse name: Not on file  . Number of children: Not on file  . Years of education: Not on file  . Highest education level: Not on file  Occupational History  . Not on file  Tobacco Use  . Smoking status: Never Smoker  . Smokeless tobacco: Never Used  Vaping Use  . Vaping Use: Never used  Substance and Sexual Activity  . Alcohol use: No  . Drug use: No  . Sexual activity: Yes    Birth control/protection: None  Other Topics Concern  . Not on file  Social History Narrative   ** Merged History Encounter **       Social Determinants of Health   Financial Resource Strain: Not on file  Food Insecurity: Not on file  Transportation Needs: Not on file  Physical Activity: Not on file  Stress: Not on file  Social Connections: Not on file     Family History:  The patient's family history includes Cancer - Prostate in her maternal grandfather; Cirrhosis in her paternal grandfather; Diabetes in her mother; Healthy in her brother, daughter, daughter, maternal grandmother, and son; Heart Problems in her maternal grandfather; Heart attack in her paternal grandmother; Hypertension in her father and mother.  ROS:   Review of Systems  Constitution: Negative for decreased appetite, fever and weight gain.  HENT: Negative for congestion, ear discharge, hoarse voice and sore throat.   Eyes: Negative for discharge, redness, vision loss in right eye and visual halos.  Cardiovascular: Negative for chest pain, dyspnea on exertion, leg swelling, orthopnea and palpitations.  Respiratory: Negative for cough, hemoptysis, shortness of breath and  snoring.   Endocrine: Negative for heat intolerance and polyphagia.  Hematologic/Lymphatic: Negative for bleeding problem. Does not bruise/bleed easily.  Skin: Negative for flushing, nail changes, rash and suspicious lesions.  Musculoskeletal: Negative for arthritis, joint pain, muscle cramps, myalgias, neck pain and stiffness.  Gastrointestinal: Negative for abdominal pain, bowel incontinence, diarrhea and excessive appetite.  Genitourinary: Negative for decreased libido, genital sores and incomplete emptying.  Neurological: Negative for brief paralysis, focal weakness, headaches and loss of balance.  Psychiatric/Behavioral: Negative for altered mental status, depression and suicidal ideas.  Allergic/Immunologic: Negative for HIV exposure and persistent infections.    EKGs/Labs/Other Studies Reviewed:    The following studies were reviewed today:   EKG: None today  Transthoracic echocardiogram November 02, 2020 IMPRESSIONS    1. Left ventricular ejection fraction, by estimation, is 60 to 65%. The  left ventricle has normal function. The left ventricle has no regional  wall motion abnormalities. Left ventricular diastolic parameters were  normal.  2. Right ventricular systolic function is normal. The right ventricular  size is normal. There is normal pulmonary artery systolic pressure.  3. The mitral valve is normal in structure. No evidence of mitral valve  regurgitation. No evidence of mitral stenosis.  4. The aortic valve is normal in structure. Aortic valve regurgitation is  not visualized. No aortic stenosis is present.  5. The inferior vena cava is normal in size with greater than 50%  respiratory variability, suggesting right atrial pressure of 3 mmHg.   FINDINGS  Left Ventricle: Left ventricular ejection fraction, by estimation, is 60  to 65%. The left ventricle has normal function. The left ventricle has no  regional wall motion abnormalities. The left ventricular  internal cavity  size was normal in size. There is  no left ventricular hypertrophy. Left ventricular diastolic parameters  were normal.   Right Ventricle: The right ventricular size is normal. No increase in  right ventricular wall thickness. Right ventricular systolic function is  normal. There is normal pulmonary artery systolic pressure. The tricuspid  regurgitant velocity is 2.35 m/s, and  with an assumed right atrial pressure of 3 mmHg, the estimated right  ventricular systolic pressure is 41.7 mmHg.   Left Atrium: Left atrial size was normal in size.   Right Atrium: Right atrial size was normal in size.   Pericardium: There is no evidence of pericardial effusion.   Mitral Valve: The mitral valve is normal in structure. No evidence of  mitral valve regurgitation. No evidence of mitral valve stenosis.   Tricuspid Valve: The tricuspid valve is normal in structure. Tricuspid  valve regurgitation is trivial. No evidence of tricuspid stenosis.   Aortic Valve: The aortic valve is normal in structure. Aortic valve  regurgitation is not visualized. No aortic stenosis is present.   Pulmonic Valve: The pulmonic valve  was normal in structure. Pulmonic valve  regurgitation is not visualized. No evidence of pulmonic stenosis.   Aorta: The aortic root is normal in size and structure.   Venous: The inferior vena cava is normal in size with greater than 50%  respiratory variability, suggesting right atrial pressure of 3 mmHg.   IAS/Shunts: No atrial level shunt detected by color flow Doppler.   ZIO monitor The patient wore the monitor for 14 days starting October 08, 2020. Indication: Syncope  The minimum heart rate was 75 bpm, maximum heart rate was 156 bpm, and average heart rate was 101 bpm. Predominant underlying rhythm was Sinus Rhythm.   Premature atrial complexes were rare less than 1%. Premature Ventricular complexes were rare less than 1%.  No atrial tachycardia,  no pauses, no supraventricular tachycardia, no AV block and no atrial fibrillation present.  2 patient triggered events associated with sinus rhythm and sinus tachycardia.  Conclusion: Normal/unremarkable study.   Recent Labs: 11/05/2020: ALT 19; BUN 12; Creatinine, Ser 0.45; Hemoglobin 11.0; Platelets 222; Potassium 4.0; Sodium 136  Recent Lipid Panel No results found for: CHOL, TRIG, HDL, CHOLHDL, VLDL, LDLCALC, LDLDIRECT  Physical Exam:    VS:  BP 110/70   Pulse 96   Ht 5\' 1"  (1.549 m)   Wt 165 lb (74.8 kg)   LMP 06/25/2020   SpO2 98%   BMI 31.18 kg/m     Wt Readings from Last 3 Encounters:  11/14/20 165 lb (74.8 kg)  11/05/20 165 lb (74.8 kg)  10/08/20 159 lb (72.1 kg)     GEN: Well nourished, well developed in no acute distress HEENT: Normal NECK: No JVD; No carotid bruits LYMPHATICS: No lymphadenopathy CARDIAC: S1S2 noted,RRR, no murmurs, rubs, gallops RESPIRATORY:  Clear to auscultation without rales, wheezing or rhonchi  ABDOMEN: Soft, non-tender, non-distended, +bowel sounds, no guarding. EXTREMITIES: No edema, No cyanosis, no clubbing MUSCULOSKELETAL:  No deformity  SKIN: Warm and dry NEUROLOGIC:  Alert and oriented x 3, non-focal PSYCHIATRIC:  Normal affect, good insight  ASSESSMENT:    1. SVT (supraventricular tachycardia) ([place])   2. Postural dizziness with presyncope   3. Obesity (BMI 30-39.9)    PLAN:     She is having some intermittent episodes of palpitations which has been aborted by her propanolol.  I discussed with the patient multiple options if this is sustained we could consider antiarrhythmic with flecainide however she will need to take propanolol on a daily basis.  But for right now the patient prefers to continue her as needed propanolol it is working in aborting her episodes.  She has not had syncope episodes.  Although she has had some instances where she has had lightheadedness.  We also discussed that post pregnancy we might consider  EP evaluation in the longer term monitor.  We will continue to monitor patient closely.  The patient understands the need to lose weight with diet and exercise. We have discussed specific strategies for this.  The patient is in agreement with the above plan. The patient left the office in stable condition.  The patient will follow up in 8 weeks or sooner if needed.   Medication Adjustments/Labs and Tests Ordered: Current medicines are reviewed at length with the patient today.  Concerns regarding medicines are outlined above.  No orders of the defined types were placed in this encounter.  No orders of the defined types were placed in this encounter.   Patient Instructions  Medication Instructions:  Your physician recommends that you continue  on your current medications as directed. Please refer to the Current Medication list given to you today.  *If you need a refill on your cardiac medications before your next appointment, please call your pharmacy*   Lab Work: None If you have labs (blood work) drawn today and your tests are completely normal, you will receive your results only by: Marland Kitchen MyChart Message (if you have MyChart) OR . A paper copy in the mail If you have any lab test that is abnormal or we need to change your treatment, we will call you to review the results.   Testing/Procedures: None   Follow-Up: At [facility], you and your health needs are our priority.  As part of our continuing mission to provide you with exceptional heart care, we have created designated Provider Care Teams.  These Care Teams include your primary Cardiologist (physician) and Advanced Practice Providers (APPs -  Physician Assistants and Nurse Practitioners) who all work together to provide you with the care you need, when you need it.  We recommend signing up for the patient portal called "MyChart".  Sign up information is provided on this After Visit Summary.  MyChart is used to connect with  patients for Virtual Visits (Telemedicine).  Patients are able to view lab/test results, encounter notes, upcoming appointments, etc.  Non-urgent messages can be sent to your provider as well.   To learn more about what you can do with MyChart, go to [email or web address].    Your next appointment:   8 week(s)  The format for your next appointment:   In Person  Provider:   Berniece Salines, DO   Other Instructions      Adopting a Healthy Lifestyle.  Know what a healthy weight is for you (roughly BMI <25) and aim to maintain this   Aim for 7+ servings of fruits and vegetables daily   65-80+ fluid ounces of water or unsweet tea for healthy kidneys   Limit to max 1 drink of alcohol per day; avoid smoking/tobacco   Limit animal fats in diet for cholesterol and heart health - choose grass fed whenever available   Avoid highly processed foods, and foods high in saturated/trans fats   Aim for low stress - take time to unwind and care for your mental health   Aim for 150 min of moderate intensity exercise weekly for heart health, and weights twice weekly for bone health   Aim for 7-9 hours of sleep daily   When it comes to diets, agreement about the perfect plan isnt easy to find, even among the experts. Experts at the [employer] developed an idea known as the Healthy Eating Plate. Just imagine a plate divided into logical, healthy portions.   The emphasis is on diet quality:   Load up on vegetables and fruits - one-half of your plate: Aim for color and variety, and remember that potatoes dont count.   Go for whole grains - one-quarter of your plate: Whole wheat, barley, wheat berries, quinoa, oats, brown rice, and foods made with them. If you want pasta, go with whole wheat pasta.   Protein power - one-quarter of your plate: Fish, chicken, beans, and nuts are all healthy, versatile protein sources. Limit red meat.   The diet, however, does go beyond the  plate, offering a few other suggestions.   Use healthy plant oils, such as olive, canola, soy, corn, sunflower and peanut. Check the labels, and avoid partially hydrogenated oil, which have unhealthy trans  fats.   If youre thirsty, drink water. Coffee and tea are good in moderation, but skip sugary drinks and limit milk and dairy products to one or two daily servings.   The type of carbohydrate in the diet is more important than the amount. Some sources of carbohydrates, such as vegetables, fruits, whole grains, and beans-are healthier than others.   Finally, stay active  Signed, Berniece Salines, DO  11/14/2020 4:47 PM    [facility]

## 2020-11-14 NOTE — Patient Instructions (Signed)
Medication Instructions:  Your physician recommends that you continue on your current medications as directed. Please refer to the Current Medication list given to you today.  *If you need a refill on your cardiac medications before your next appointment, please call your pharmacy*   Lab Work: None If you have labs (blood work) drawn today and your tests are completely normal, you will receive your results only by: Marland Kitchen MyChart Message (if you have MyChart) OR . A paper copy in the mail If you have any lab test that is abnormal or we need to change your treatment, we will call you to review the results.   Testing/Procedures: None   Follow-Up: At [facility], you and your health needs are our priority.  As part of our continuing mission to provide you with exceptional heart care, we have created designated Provider Care Teams.  These Care Teams include your primary Cardiologist (physician) and Advanced Practice Providers (APPs -  Physician Assistants and Nurse Practitioners) who all work together to provide you with the care you need, when you need it.  We recommend signing up for the patient portal called "MyChart".  Sign up information is provided on this After Visit Summary.  MyChart is used to connect with patients for Virtual Visits (Telemedicine).  Patients are able to view lab/test results, encounter notes, upcoming appointments, etc.  Non-urgent messages can be sent to your provider as well.   To learn more about what you can do with MyChart, go to [email or web address].    Your next appointment:   8 week(s)  The format for your next appointment:   In Person  Provider:   Berniece Salines, DO   Other Instructions

## 2020-11-16 DIAGNOSIS — D649 Anemia, unspecified: Secondary | ICD-10-CM | POA: Diagnosis not present

## 2020-11-16 DIAGNOSIS — Z369 Encounter for antenatal screening, unspecified: Secondary | ICD-10-CM | POA: Diagnosis not present

## 2020-11-16 DIAGNOSIS — R5383 Other fatigue: Secondary | ICD-10-CM | POA: Diagnosis not present

## 2020-11-22 ENCOUNTER — Other Ambulatory Visit: Payer: BC Managed Care – PPO

## 2020-11-22 ENCOUNTER — Encounter: Payer: Self-pay | Admitting: *Deleted

## 2020-11-22 ENCOUNTER — Other Ambulatory Visit: Payer: Self-pay

## 2020-11-22 ENCOUNTER — Other Ambulatory Visit: Payer: Self-pay | Admitting: *Deleted

## 2020-11-22 ENCOUNTER — Ambulatory Visit: Payer: BC Managed Care – PPO | Attending: Obstetrics and Gynecology | Admitting: *Deleted

## 2020-11-22 ENCOUNTER — Ambulatory Visit (HOSPITAL_BASED_OUTPATIENT_CLINIC_OR_DEPARTMENT_OTHER): Payer: BC Managed Care – PPO

## 2020-11-22 VITALS — BP 119/69 | HR 102

## 2020-11-22 DIAGNOSIS — O09219 Supervision of pregnancy with history of pre-term labor, unspecified trimester: Secondary | ICD-10-CM

## 2020-11-22 DIAGNOSIS — Z3686 Encounter for antenatal screening for cervical length: Secondary | ICD-10-CM

## 2020-11-22 DIAGNOSIS — O99412 Diseases of the circulatory system complicating pregnancy, second trimester: Secondary | ICD-10-CM

## 2020-11-22 DIAGNOSIS — I471 Supraventricular tachycardia: Secondary | ICD-10-CM

## 2020-11-22 DIAGNOSIS — Z3A20 20 weeks gestation of pregnancy: Secondary | ICD-10-CM

## 2020-11-22 DIAGNOSIS — Z8751 Personal history of pre-term labor: Secondary | ICD-10-CM

## 2020-11-22 DIAGNOSIS — O09212 Supervision of pregnancy with history of pre-term labor, second trimester: Secondary | ICD-10-CM

## 2020-11-22 DIAGNOSIS — Z363 Encounter for antenatal screening for malformations: Secondary | ICD-10-CM | POA: Diagnosis not present

## 2020-11-22 DIAGNOSIS — O09522 Supervision of elderly multigravida, second trimester: Secondary | ICD-10-CM

## 2020-11-22 DIAGNOSIS — I519 Heart disease, unspecified: Secondary | ICD-10-CM

## 2020-12-17 DIAGNOSIS — Z369 Encounter for antenatal screening, unspecified: Secondary | ICD-10-CM | POA: Diagnosis not present

## 2020-12-17 DIAGNOSIS — R252 Cramp and spasm: Secondary | ICD-10-CM | POA: Diagnosis not present

## 2020-12-27 ENCOUNTER — Encounter (HOSPITAL_COMMUNITY): Payer: Self-pay | Admitting: Obstetrics and Gynecology

## 2020-12-27 ENCOUNTER — Inpatient Hospital Stay (HOSPITAL_COMMUNITY)
Admission: AD | Admit: 2020-12-27 | Discharge: 2020-12-27 | Disposition: A | Discharge status: Home / Self Care | Payer: BC Managed Care – PPO | Attending: Obstetrics and Gynecology | Admitting: Obstetrics and Gynecology

## 2020-12-27 ENCOUNTER — Other Ambulatory Visit: Payer: Self-pay

## 2020-12-27 DIAGNOSIS — Z79899 Other long term (current) drug therapy: Secondary | ICD-10-CM | POA: Diagnosis not present

## 2020-12-27 DIAGNOSIS — M545 Low back pain, unspecified: Secondary | ICD-10-CM | POA: Diagnosis not present

## 2020-12-27 DIAGNOSIS — R109 Unspecified abdominal pain: Secondary | ICD-10-CM

## 2020-12-27 DIAGNOSIS — O26892 Other specified pregnancy related conditions, second trimester: Secondary | ICD-10-CM

## 2020-12-27 DIAGNOSIS — M549 Dorsalgia, unspecified: Secondary | ICD-10-CM | POA: Insufficient documentation

## 2020-12-27 DIAGNOSIS — Z3A25 25 weeks gestation of pregnancy: Secondary | ICD-10-CM

## 2020-12-27 DIAGNOSIS — O99891 Other specified diseases and conditions complicating pregnancy: Secondary | ICD-10-CM

## 2020-12-27 DIAGNOSIS — O0942 Supervision of pregnancy with grand multiparity, second trimester: Secondary | ICD-10-CM | POA: Insufficient documentation

## 2020-12-27 LAB — URINALYSIS, ROUTINE W REFLEX MICROSCOPIC
Bilirubin Urine: NEGATIVE
Glucose, UA: NEGATIVE mg/dL
Hgb urine dipstick: NEGATIVE
Ketones, ur: NEGATIVE mg/dL
Leukocytes,Ua: NEGATIVE
Nitrite: NEGATIVE
Protein, ur: NEGATIVE mg/dL
Specific Gravity, Urine: 1.031 — ABNORMAL HIGH (ref 1.005–1.030)
pH: 6 (ref 5.0–8.0)

## 2020-12-27 MED ORDER — CYCLOBENZAPRINE HCL 10 MG PO TABS
10.0000 mg | ORAL_TABLET | Freq: Two times a day (BID) | ORAL | 0 refills | Status: DC | PRN
Start: 2020-12-27 — End: 2021-06-11

## 2020-12-27 MED ORDER — ACETAMINOPHEN 500 MG PO TABS
1000.0000 mg | ORAL_TABLET | Freq: Once | ORAL | Status: AC
  Administered 2020-12-27: 1000 mg via ORAL
  Filled 2020-12-27: qty 2

## 2020-12-27 MED ORDER — CYCLOBENZAPRINE HCL 5 MG PO TABS
7.5000 mg | ORAL_TABLET | Freq: Once | ORAL | Status: AC
  Administered 2020-12-27: 7.5 mg via ORAL
  Filled 2020-12-27: qty 2

## 2020-12-27 MED ORDER — OXYCODONE HCL 5 MG PO TABS: 5.0000 mg | ORAL_TABLET | ORAL | 0 refills | Status: DC | PRN

## 2020-12-27 NOTE — MAU Note (Addendum)
.  Amanda Mooney is a 40 y.o. at [redacted]w[redacted]d here in MAU reporting: Right lower back pain that began yesterday. Patient states she tried applied heat and Tylenol with no relief. She states she woke up this morning at 0400 with lower abdominal pain which she states feels like menstrual cramps, with the continuing back pain. She states she last took Tylenol at 1200 with no relief. +FM. Denies VB or LOF. Denies urinary complications or frequency.  Vitals:   12/27/20 1625  BP: 115/66  Pulse: (!) 103  Resp: 18  Temp: 99.3 F (37.4 C)     FHT: 155 initial Lab orders placed from triage: UA

## 2020-12-27 NOTE — MAU Provider Note (Signed)
History     [id number]  Arrival date and time: 12/27/20 1605    Chief Complaint  Patient presents with  . Abdominal Pain  . Back Pain     HPI Amanda Mooney is a 40 y.o. at [redacted]w[redacted]d by early [place] with PMHx notable for late preterm deliveries, who presents for abdominal and back pain.    Patient reports that starting last night around 0400 she began to have low back pain and lower abdominal pain, R>L Describes it as a constant crampy period like pain Tried tylenol but this did not make it better Getting up and moving around makes it worse, resting makes it better Denies vaginal bleeding or loss of fluid Denies contractions Endorses good fetal movement Denies fever, nausea/vomiting, burning or pain with urination, vaginal discharge Denies numbness or tingling in extremities     OB History    Gravida  7   Para  3   Term  1   Preterm  2   AB  3   Living  3     SAB  1   IAB  2   Ectopic      Multiple      Live Births  1           Past Medical History:  Diagnosis Date  . Abnormal Pap smear   . Anemia   . Chlamydia   . Complication of anesthesia    hard to arrouse after "twilight" anesthesia  . Migraine   . Tachycardia     Past Surgical History:  Procedure Laterality Date  . CERVICAL DISC ARTHROPLASTY N/A 02/01/2019   Procedure: CERVICAL ANTERIOR DISC ARTHROPLASTY  C6-7;  Surgeon: Meade Maw, MD;  Location: [facility] ORS;  Service: Neurosurgery;  Laterality: N/A;  . COLPOSCOPY    . HERNIA REPAIR      Family History  Problem Relation Age of Onset  . Diabetes Mother   . Hypertension Mother   . Hypertension Father   . Healthy Maternal Grandmother   . Cancer - Prostate Maternal Grandfather   . Heart Problems Maternal Grandfather   . Heart attack Paternal Grandmother   . Cirrhosis Paternal Grandfather   . Healthy Brother   . Healthy Daughter   . Healthy Daughter   . Healthy Son     Social History   Socioeconomic History  . Marital  status: Married    Spouse name: Not on file  . Number of children: Not on file  . Years of education: Not on file  . Highest education level: Not on file  Occupational History  . Not on file  Tobacco Use  . Smoking status: Never Smoker  . Smokeless tobacco: Never Used  Vaping Use  . Vaping Use: Never used  Substance and Sexual Activity  . Alcohol use: No  . Drug use: No  . Sexual activity: Yes    Birth control/protection: None  Other Topics Concern  . Not on file  Social History Narrative   ** Merged History Encounter **       Social Determinants of Health   Financial Resource Strain: Not on file  Food Insecurity: Not on file  Transportation Needs: Not on file  Physical Activity: Not on file  Stress: Not on file  Social Connections: Not on file  Intimate Partner Violence: Not on file    No Known Allergies  No current facility-administered medications on file prior to encounter.   Current Outpatient Medications on File Prior to Encounter  Medication Sig Dispense Refill  . acetaminophen (TYLENOL) 500 MG tablet Take 1,000 mg by mouth every 6 (six) hours as needed.    . cholecalciferol (VITAMIN D3) 25 MCG (1000 UNIT) tablet Take 1,000 Units by mouth daily.    . ferrous sulfate 325 (65 FE) MG tablet Take 325 mg by mouth daily with breakfast.    . Prenatal Vit-DSS-Fe Cbn-FA (PRENATAL AD PO) Take 1 tablet by mouth daily at 6 (six) AM.    . propranolol (INDERAL) 20 MG tablet Take one tablet by mouth once daily as needed for palpations or heart rate greater than 130 90 tablet 3     ROS Pertinent positives and negative per HPI, all others reviewed and negative  Physical Exam   BP 120/70 (BP Location: Right Arm)   Pulse 87   Temp 99.3 F (37.4 C) (Oral)   Resp 16   LMP 06/25/2020   SpO2 100%   Patient Vitals for the past 24 hrs:  BP Temp Temp src Pulse Resp SpO2  12/27/20 1859 120/70 -- -- 87 16 100 %  12/27/20 1641 119/71 -- -- (!) 108 -- --  12/27/20 1632  115/66 -- -- (!) 109 -- 98 %  12/27/20 1625 115/66 99.3 F (37.4 C) Oral (!) 103 18 --    Physical Exam Vitals reviewed.  Constitutional:      General: She is not in acute distress.    Appearance: She is well-developed. She is not diaphoretic.  Eyes:     General: No scleral icterus. Pulmonary:     Effort: Pulmonary effort is normal. No respiratory distress.  Abdominal:     General: There is no distension.     Palpations: Abdomen is soft.     Tenderness: There is abdominal tenderness. There is no guarding or rebound.     Comments: Mild ttp of lower quadrants, R>L  Musculoskeletal:     Comments: Mild lumbosacral paraspinal muscle tenderness, R>L  Skin:    General: Skin is warm and dry.  Neurological:     Mental Status: She is alert.     Coordination: Coordination normal.      Cervical Exam Dilation: Closed Exam by::  (Dr. Dione Plover, MD)  Bedside Ultrasound Not done  My interpretation: n/a  FHT Baseline 150, moderate variability, +accels, no decels Toco: quiet Cat: I  Labs Results for orders placed or performed during the hospital encounter of 12/27/20 (from the past 24 hour(s))  Urinalysis, Routine w reflex microscopic Urine, Clean Catch     Status: Abnormal   Collection Time: 12/27/20  4:37 PM  Result Value Ref Range   Color, Urine YELLOW YELLOW   APPearance CLEAR CLEAR   Specific Gravity, Urine 1.031 (H) 1.005 - 1.030   pH 6.0 5.0 - 8.0   Glucose, UA NEGATIVE NEGATIVE mg/dL   Hgb urine dipstick NEGATIVE NEGATIVE   Bilirubin Urine NEGATIVE NEGATIVE   Ketones, ur NEGATIVE NEGATIVE mg/dL   Protein, ur NEGATIVE NEGATIVE mg/dL   Nitrite NEGATIVE NEGATIVE   Leukocytes,Ua NEGATIVE NEGATIVE    Imaging No results found.  MAU Course  Procedures  Lab Orders     Urinalysis, Routine w reflex microscopic Urine, Clean Catch Meds ordered this encounter  Medications  . cyclobenzaprine (FLEXERIL) tablet 7.5 mg  . acetaminophen (TYLENOL) tablet 1,000 mg  .  cyclobenzaprine (FLEXERIL) 10 MG tablet    Sig: Take 1 tablet (10 mg total) by mouth 2 (two) times daily as needed for muscle spasms.    Dispense:  20 tablet    Refill:  0  . oxyCODONE (ROXICODONE) 5 MG immediate release tablet    Sig: Take 1 tablet (5 mg total) by mouth every 4 (four) hours as needed for severe pain.    Dispense:  4 tablet    Refill:  0   Imaging Orders  No imaging studies ordered today    MDM moderate  Assessment and Plan  #Abdominal pain in pregnancy, second trimester #Acute low back pain, uncomplicated Unclear etiology. Cervical exam remains closed on serial exam. Cervix anterior and medium consistency but reports retroverted uterus and she is a multip. No contractions on monitor. Overall low suspicion for preterm labor. No signs of infection. No neurological symptoms. UA without blood, low suspicion for nephrolithiasis. Ruled out for significant pathology, most likely MSK vs other early abdominal pathology that has not declared itself yet. Reviewed differential and return precautions in extensive detail with patient. Agreeable with course of flexeril and small (4 tabs) supply of oxycodone.   #FWB FHT Cat I NST: Reactive  Discharged to home in stable condition.  Clarnce Flock, MD/MPH 12/27/20 7:11 PM  Allergies as of 12/27/2020   No Known Allergies     Medication List    TAKE these medications   acetaminophen 500 MG tablet Commonly known as: TYLENOL Take 1,000 mg by mouth every 6 (six) hours as needed.   cholecalciferol 25 MCG (1000 UNIT) tablet Commonly known as: VITAMIN D3 Take 1,000 Units by mouth daily.   cyclobenzaprine 10 MG tablet Commonly known as: FLEXERIL Take 1 tablet (10 mg total) by mouth 2 (two) times daily as needed for muscle spasms.   ferrous sulfate 325 (65 FE) MG tablet Take 325 mg by mouth daily with breakfast.   oxyCODONE 5 MG immediate release tablet Commonly known as: Roxicodone Take 1 tablet (5 mg total) by mouth  every 4 (four) hours as needed for severe pain.   PRENATAL AD PO Take 1 tablet by mouth daily at 6 (six) AM.   propranolol 20 MG tablet Commonly known as: INDERAL Take one tablet by mouth once daily as needed for palpations or heart rate greater than 130

## 2021-01-16 DIAGNOSIS — Z23 Encounter for immunization: Secondary | ICD-10-CM | POA: Diagnosis not present

## 2021-01-16 DIAGNOSIS — Z3480 Encounter for supervision of other normal pregnancy, unspecified trimester: Secondary | ICD-10-CM | POA: Diagnosis not present

## 2021-01-17 ENCOUNTER — Ambulatory Visit: Payer: BC Managed Care – PPO

## 2021-01-18 ENCOUNTER — Encounter: Payer: Self-pay | Admitting: *Deleted

## 2021-01-18 ENCOUNTER — Other Ambulatory Visit: Payer: Self-pay

## 2021-01-18 ENCOUNTER — Ambulatory Visit: Payer: BC Managed Care – PPO | Attending: Maternal & Fetal Medicine | Admitting: *Deleted

## 2021-01-18 ENCOUNTER — Other Ambulatory Visit: Payer: Self-pay | Admitting: Obstetrics

## 2021-01-18 ENCOUNTER — Ambulatory Visit (HOSPITAL_BASED_OUTPATIENT_CLINIC_OR_DEPARTMENT_OTHER): Payer: BC Managed Care – PPO

## 2021-01-18 VITALS — BP 121/72 | HR 92

## 2021-01-18 DIAGNOSIS — Z3A28 28 weeks gestation of pregnancy: Secondary | ICD-10-CM | POA: Diagnosis not present

## 2021-01-18 DIAGNOSIS — Z3686 Encounter for antenatal screening for cervical length: Secondary | ICD-10-CM

## 2021-01-18 DIAGNOSIS — O09523 Supervision of elderly multigravida, third trimester: Secondary | ICD-10-CM | POA: Diagnosis not present

## 2021-01-18 DIAGNOSIS — I471 Supraventricular tachycardia: Secondary | ICD-10-CM | POA: Insufficient documentation

## 2021-01-18 DIAGNOSIS — O09293 Supervision of pregnancy with other poor reproductive or obstetric history, third trimester: Secondary | ICD-10-CM | POA: Diagnosis not present

## 2021-01-18 DIAGNOSIS — Z8751 Personal history of pre-term labor: Secondary | ICD-10-CM | POA: Insufficient documentation

## 2021-01-18 DIAGNOSIS — O09213 Supervision of pregnancy with history of pre-term labor, third trimester: Secondary | ICD-10-CM

## 2021-01-18 DIAGNOSIS — I519 Heart disease, unspecified: Secondary | ICD-10-CM

## 2021-01-18 DIAGNOSIS — Z362 Encounter for other antenatal screening follow-up: Secondary | ICD-10-CM

## 2021-01-18 DIAGNOSIS — O99413 Diseases of the circulatory system complicating pregnancy, third trimester: Secondary | ICD-10-CM | POA: Diagnosis not present

## 2021-01-31 DIAGNOSIS — O99810 Abnormal glucose complicating pregnancy: Secondary | ICD-10-CM | POA: Diagnosis not present

## 2021-02-06 ENCOUNTER — Ambulatory Visit: Payer: BC Managed Care – PPO | Admitting: Cardiology

## 2021-02-13 DIAGNOSIS — Z369 Encounter for antenatal screening, unspecified: Secondary | ICD-10-CM | POA: Diagnosis not present

## 2021-02-27 DIAGNOSIS — N898 Other specified noninflammatory disorders of vagina: Secondary | ICD-10-CM | POA: Diagnosis not present

## 2021-02-27 DIAGNOSIS — Z113 Encounter for screening for infections with a predominantly sexual mode of transmission: Secondary | ICD-10-CM | POA: Diagnosis not present

## 2021-02-27 DIAGNOSIS — Z369 Encounter for antenatal screening, unspecified: Secondary | ICD-10-CM | POA: Diagnosis not present

## 2021-02-27 DIAGNOSIS — O99019 Anemia complicating pregnancy, unspecified trimester: Secondary | ICD-10-CM | POA: Diagnosis not present

## 2021-02-27 DIAGNOSIS — R0600 Dyspnea, unspecified: Secondary | ICD-10-CM | POA: Diagnosis not present

## 2021-03-05 ENCOUNTER — Other Ambulatory Visit: Payer: Self-pay

## 2021-03-05 ENCOUNTER — Encounter: Payer: Self-pay | Admitting: *Deleted

## 2021-03-05 ENCOUNTER — Ambulatory Visit: Payer: BC Managed Care – PPO | Attending: Obstetrics

## 2021-03-05 ENCOUNTER — Ambulatory Visit: Payer: BC Managed Care – PPO | Admitting: *Deleted

## 2021-03-05 VITALS — BP 111/63 | HR 83

## 2021-03-05 DIAGNOSIS — I519 Heart disease, unspecified: Secondary | ICD-10-CM | POA: Insufficient documentation

## 2021-03-05 DIAGNOSIS — Z3686 Encounter for antenatal screening for cervical length: Secondary | ICD-10-CM | POA: Diagnosis not present

## 2021-03-05 DIAGNOSIS — O09523 Supervision of elderly multigravida, third trimester: Secondary | ICD-10-CM | POA: Diagnosis not present

## 2021-03-05 DIAGNOSIS — I471 Supraventricular tachycardia: Secondary | ICD-10-CM

## 2021-03-05 DIAGNOSIS — O99413 Diseases of the circulatory system complicating pregnancy, third trimester: Secondary | ICD-10-CM | POA: Diagnosis not present

## 2021-03-05 DIAGNOSIS — Z362 Encounter for other antenatal screening follow-up: Secondary | ICD-10-CM

## 2021-03-05 DIAGNOSIS — Z3A34 34 weeks gestation of pregnancy: Secondary | ICD-10-CM

## 2021-03-05 DIAGNOSIS — O99213 Obesity complicating pregnancy, third trimester: Secondary | ICD-10-CM

## 2021-03-12 DIAGNOSIS — D696 Thrombocytopenia, unspecified: Secondary | ICD-10-CM | POA: Diagnosis not present

## 2021-03-12 DIAGNOSIS — Z369 Encounter for antenatal screening, unspecified: Secondary | ICD-10-CM | POA: Diagnosis not present

## 2021-03-22 DIAGNOSIS — O09523 Supervision of elderly multigravida, third trimester: Secondary | ICD-10-CM | POA: Diagnosis not present

## 2021-03-22 DIAGNOSIS — Z3480 Encounter for supervision of other normal pregnancy, unspecified trimester: Secondary | ICD-10-CM | POA: Diagnosis not present

## 2021-03-22 LAB — OB RESULTS CONSOLE GBS: GBS: POSITIVE

## 2021-03-25 ENCOUNTER — Ambulatory Visit: Payer: BC Managed Care – PPO | Admitting: Cardiology

## 2021-03-25 ENCOUNTER — Other Ambulatory Visit: Payer: Self-pay

## 2021-03-25 ENCOUNTER — Encounter: Payer: Self-pay | Admitting: Cardiology

## 2021-03-25 VITALS — BP 110/74 | HR 102 | Ht 61.0 in | Wt 179.0 lb

## 2021-03-25 DIAGNOSIS — I471 Supraventricular tachycardia: Secondary | ICD-10-CM | POA: Diagnosis not present

## 2021-03-25 NOTE — Progress Notes (Signed)
Amanda Mooney  Follow Up Note   Date:  03/25/2021   ID:  Mooney, Amanda [birth year], MRN [id number]  PCP:  London Pepper, MD   [facility] Providers Cardiologist:  Berniece Salines, DO  Electrophysiologist:  None        Referring MD: London Pepper, MD   Chief Complaint: " I am doing much better- propanolol still helping"  History of Present Illness:    Amanda Mooney is a 40 y.o. female [id number] who returns for follow up of paroxysmal SVT which was diagnosed about 10 to 15 years ago, at that time the patient tells me that she was considered for ablation but first she was giving some metoprolol which helped in ablation was not pursued.     Of note did tell me at her initial visit that her last 2 pregnancies she has had multiple syncope episodes with the most recent pregnancy in her 2-year-old when she had 2 syncope episodes most of which she feels lightheaded, some palpitations dizziness and most times if she does not lie flat she would pass out.  Recently she has been able to control the syncope episodes by just lying flat when she experiences the symptoms.   During her visit given her palpitations and history of SVT I placed a monitor on the patient as well as get an echocardiogram.  I last saw the patient on November 14, 2020 at that time we continued her propanolol as needed.  Since I saw the patient she tells me that she has not had any syncope episodes and her propanolol has been aborting her SVT.  No other complaints at this time.   Prior CV Studies Reviewed: The following studies were reviewed today:  Transthoracic echocardiogram November 02, 2020 IMPRESSIONS     1. Left ventricular ejection fraction, by estimation, is 60 to 65%. The  left ventricle has normal function. The left ventricle has no regional  wall motion abnormalities. Left ventricular diastolic parameters were  normal.   2. Right ventricular systolic function is normal. The right ventricular  size  is normal. There is normal pulmonary artery systolic pressure.   3. The mitral valve is normal in structure. No evidence of mitral valve  regurgitation. No evidence of mitral stenosis.   4. The aortic valve is normal in structure. Aortic valve regurgitation is  not visualized. No aortic stenosis is present.   5. The inferior vena cava is normal in size with greater than 50%  respiratory variability, suggesting right atrial pressure of 3 mmHg.   FINDINGS   Left Ventricle: Left ventricular ejection fraction, by estimation, is 60  to 65%. The left ventricle has normal function. The left ventricle has no  regional wall motion abnormalities. The left ventricular internal cavity  size was normal in size. There is   no left ventricular hypertrophy. Left ventricular diastolic parameters  were normal.   Right Ventricle: The right ventricular size is normal. No increase in  right ventricular wall thickness. Right ventricular systolic function is  normal. There is normal pulmonary artery systolic pressure. The tricuspid  regurgitant velocity is 2.35 m/s, and   with an assumed right atrial pressure of 3 mmHg, the estimated right  ventricular systolic pressure is 99991111 mmHg.   Left Atrium: Left atrial size was normal in size.   Right Atrium: Right atrial size was normal in size.   Pericardium: There is no evidence of pericardial effusion.   Mitral Valve: The mitral valve is normal in structure.  No evidence of  mitral valve regurgitation. No evidence of mitral valve stenosis.   Tricuspid Valve: The tricuspid valve is normal in structure. Tricuspid  valve regurgitation is trivial. No evidence of tricuspid stenosis.   Aortic Valve: The aortic valve is normal in structure. Aortic valve  regurgitation is not visualized. No aortic stenosis is present.   Pulmonic Valve: The pulmonic valve was normal in structure. Pulmonic valve  regurgitation is not visualized. No evidence of pulmonic stenosis.    Aorta: The aortic root is normal in size and structure.   Venous: The inferior vena cava is normal in size with greater than 50%  respiratory variability, suggesting right atrial pressure of 3 mmHg.   IAS/Shunts: No atrial level shunt detected by color flow Doppler.   ZIO monitor The patient wore the monitor for 14 days starting October 08, 2020. Indication: Syncope   The minimum heart rate was 75 bpm, maximum heart rate was 156 bpm, and average heart rate was 101 bpm. Predominant underlying rhythm was Sinus Rhythm.     Premature atrial complexes were rare less than 1%. Premature Ventricular complexes were rare less than 1%.   No atrial tachycardia, no pauses, no supraventricular tachycardia, no AV block and no atrial fibrillation present.   2 patient triggered events associated with sinus rhythm and sinus tachycardia.   Conclusion: Normal/unremarkable study.    Past Medical History:  Diagnosis Date   Abnormal Pap smear    Anemia    Chlamydia    Complication of anesthesia    hard to arrouse after "twilight" anesthesia   Migraine    Tachycardia     Past Surgical History:  Procedure Laterality Date   CERVICAL DISC ARTHROPLASTY N/A 02/01/2019   Procedure: CERVICAL ANTERIOR DISC ARTHROPLASTY  C6-7;  Surgeon: Meade Maw, MD;  Location: [facility] ORS;  Service: Neurosurgery;  Laterality: N/A;   COLPOSCOPY     HERNIA REPAIR        OB History     Gravida  7   Para  3   Term  1   Preterm  2   AB  3   Living  3      SAB  1   IAB  2   Ectopic      Multiple      Live Births  1               Current Medications: No outpatient medications have been marked as taking for the 03/25/21 encounter (Office Visit) with Berniece Salines, DO.     Allergies:   Patient has no known allergies.   Social History   Socioeconomic History   Marital status: Married    Spouse name: Not on file   Number of children: Not on file   Years of education: Not on file    Highest education level: Not on file  Occupational History   Not on file  Tobacco Use   Smoking status: Never   Smokeless tobacco: Never  Vaping Use   Vaping Use: Never used  Substance and Sexual Activity   Alcohol use: No   Drug use: No   Sexual activity: Yes    Birth control/protection: None  Other Topics Concern   Not on file  Social History Narrative   ** Merged History Encounter **       Social Determinants of Health   Financial Resource Strain: Not on file  Food Insecurity: Not on file  Transportation Needs: Not on file  Physical  Activity: Not on file  Stress: Not on file  Social Connections: Not on file      Family History  Problem Relation Age of Onset   Diabetes Mother    Hypertension Mother    Hypertension Father    Healthy Maternal Grandmother    Cancer - Prostate Maternal Grandfather    Heart Problems Maternal Grandfather    Heart attack Paternal Grandmother    Cirrhosis Paternal Grandfather    Healthy Brother    Healthy Daughter    Healthy Daughter    Healthy Son       ROS:   Please see the history of present illness.     All other systems reviewed and are negative.   Labs/EKG Reviewed:    EKG:   EKG was not ordered today.     Recent Labs: 11/05/2020: ALT 19; BUN 12; Creatinine, Ser 0.45; Hemoglobin 11.0; Platelets 222; Potassium 4.0; Sodium 136   Recent Lipid Panel No results found for: CHOL, TRIG, HDL, CHOLHDL, LDLCALC, LDLDIRECT  Physical Exam:    VS:  BP 110/74 (BP Location: Right Arm, Patient Position: Sitting, Cuff Size: Normal)   Pulse (!) 102   Ht '5\' 1"'$  (1.549 m)   Wt 179 lb 0.6 oz (81.2 kg)   LMP 06/25/2020   SpO2 98%   BMI 33.83 kg/m     Wt Readings from Last 3 Encounters:  03/25/21 179 lb 0.6 oz (81.2 kg)  11/14/20 165 lb (74.8 kg)  11/05/20 165 lb (74.8 kg)     GEN:  Well nourished, well developed in no acute distress HEENT: Normal NECK: No JVD; No carotid bruits LYMPHATICS: No lymphadenopathy CARDIAC: RRR, no  murmurs, rubs, gallops RESPIRATORY:  Clear to auscultation without rales, wheezing or rhonchi  ABDOMEN: Soft, non-tender, non-distended MUSCULOSKELETAL:  No edema; No deformity  SKIN: Warm and dry NEUROLOGIC:  Alert and oriented x 3 PSYCHIATRIC:  Normal affect    Risk Assessment/Risk Calculators:     CARPREG II Risk Prediction Index Score:  4.  The patient's risk for a primary cardiac event is 22%.            ASSESSMENT & PLAN:    Paroxysmal supraventricular tachycardia  She will continue her current medication dosing with her propranolol as needed.  Her symptoms has not exacerbated since I last saw her.  She is planning induction next week and vaginal delivery.  I discussed with the patient is signs for postpartum depression and to notify us if she starts to experience any of these so we can refer her to our behavioral health partners.  Follow-up in 8 weeks virtual visit postpartum.  Patient Instructions  Medication Instructions:  Your physician recommends that you continue on your current medications as directed. Please refer to the Current Medication list given to you today.  *If you need a refill on your cardiac medications before your next appointment, please call your pharmacy*   Lab Work: NONE If you have labs (blood work) drawn today and your tests are completely normal, you will receive your results only by: [place] (if you have MyChart) OR A paper copy in the mail If you have any lab test that is abnormal or we need to change your treatment, we will call you to review the results.   Testing/Procedures: NONE   Follow-Up: At [facility], you and your health needs are our priority.  As part of our continuing mission to provide you with exceptional heart care, we have created designated Provider  Care Teams.  These Care Teams include your primary Cardiologist (physician) and Advanced Practice Providers (APPs -  Physician Assistants and Nurse  Practitioners) who all work together to provide you with the care you need, when you need it.  We recommend signing up for the patient portal called "MyChart".  Sign up information is provided on this After Visit Summary.  MyChart is used to connect with patients for Virtual Visits (Telemedicine).  Patients are able to view lab/test results, encounter notes, upcoming appointments, etc.  Non-urgent messages can be sent to your provider as well.   To learn more about what you can do with MyChart, go to [email or web address].    Your next appointment:   8 week(s)  The format for your next appointment:   Virtual Visit   Provider:   Dr. Debbe Mounts Lizzet Hendley @ [facility]    Other Instructions     Dispo:  No follow-ups on file.   Medication Adjustments/Labs and Tests Ordered: Current medicines are reviewed at length with the patient today.  Concerns regarding medicines are outlined above.  Tests Ordered: No orders of the defined types were placed in this encounter.  Medication Changes: No orders of the defined types were placed in this encounter.

## 2021-03-25 NOTE — Patient Instructions (Signed)
Medication Instructions:  Your physician recommends that you continue on your current medications as directed. Please refer to the Current Medication list given to you today.  *If you need a refill on your cardiac medications before your next appointment, please call your pharmacy*   Lab Work: NONE If you have labs (blood work) drawn today and your tests are completely normal, you will receive your results only by: [place] (if you have MyChart) OR A paper copy in the mail If you have any lab test that is abnormal or we need to change your treatment, we will call you to review the results.   Testing/Procedures: NONE   Follow-Up: At [facility], you and your health needs are our priority.  As part of our continuing mission to provide you with exceptional heart care, we have created designated Provider Care Teams.  These Care Teams include your primary Cardiologist (physician) and Advanced Practice Providers (APPs -  Physician Assistants and Nurse Practitioners) who all work together to provide you with the care you need, when you need it.  We recommend signing up for the patient portal called "MyChart".  Sign up information is provided on this After Visit Summary.  MyChart is used to connect with patients for Virtual Visits (Telemedicine).  Patients are able to view lab/test results, encounter notes, upcoming appointments, etc.  Non-urgent messages can be sent to your provider as well.   To learn more about what you can do with MyChart, go to [email or web address].    Your next appointment:   8 week(s)  The format for your next appointment:   Virtual Visit   Provider:   Dr. Debbe Mounts [place] @ [facility]    Other Instructions

## 2021-03-26 DIAGNOSIS — Z3483 Encounter for supervision of other normal pregnancy, third trimester: Secondary | ICD-10-CM | POA: Diagnosis not present

## 2021-03-27 DIAGNOSIS — D6959 Other secondary thrombocytopenia: Secondary | ICD-10-CM | POA: Diagnosis not present

## 2021-03-27 DIAGNOSIS — O09523 Supervision of elderly multigravida, third trimester: Secondary | ICD-10-CM | POA: Diagnosis not present

## 2021-03-28 ENCOUNTER — Encounter (HOSPITAL_COMMUNITY): Payer: Self-pay | Admitting: *Deleted

## 2021-03-28 ENCOUNTER — Telehealth (HOSPITAL_COMMUNITY): Payer: Self-pay | Admitting: *Deleted

## 2021-03-28 NOTE — Telephone Encounter (Signed)
Preadmission screen  

## 2021-03-30 ENCOUNTER — Inpatient Hospital Stay (HOSPITAL_COMMUNITY)
Admission: AD | Admit: 2021-03-30 | Discharge: 2021-03-30 | Disposition: A | Discharge status: Home / Self Care | Payer: BC Managed Care – PPO | Attending: Obstetrics and Gynecology | Admitting: Obstetrics and Gynecology

## 2021-03-30 ENCOUNTER — Other Ambulatory Visit: Payer: Self-pay

## 2021-03-30 ENCOUNTER — Encounter (HOSPITAL_COMMUNITY): Payer: Self-pay | Admitting: Obstetrics and Gynecology

## 2021-03-30 DIAGNOSIS — Z3A38 38 weeks gestation of pregnancy: Secondary | ICD-10-CM | POA: Diagnosis not present

## 2021-03-30 DIAGNOSIS — Z3689 Encounter for other specified antenatal screening: Secondary | ICD-10-CM | POA: Diagnosis not present

## 2021-03-30 DIAGNOSIS — O471 False labor at or after 37 completed weeks of gestation: Secondary | ICD-10-CM | POA: Diagnosis not present

## 2021-03-30 DIAGNOSIS — O479 False labor, unspecified: Secondary | ICD-10-CM

## 2021-03-30 NOTE — MAU Provider Note (Signed)
S: [name] is a 40 y.o. [id number] at [redacted]w[redacted]d who presents to MAU today for labor evaluation.   Nurse reports patient without cervical change after 1.5 hours.  Cervical exam by RN:  Dilation: 1.5 Effacement (%): 60 Station: -3 Presentation: Vertex (BS [place] Exam by:: MMarcelyn Bruins RN  Fetal Monitoring: Baseline: 135 Variability: Moderate Accelerations: Present Decelerations: None Contractions: Irritability    Patient informed that the ultrasound is considered a limited OB ultrasound and is not intended to be a complete ultrasound exam.  Patient also informed that the ultrasound is not being completed with the intent of assessing for fetal or placental anomalies or any pelvic abnormalities.  Explained that the purpose of today's ultrasound is to assess for  presentation.  Patient acknowledges the purpose of the exam and the limitations of the study.    MDM Discussed patient with RN. NST reviewed.   A: SIUP at 340w2dFalse labor NST Reactive   P: Vertex Presentation No Cervical Change Discharge home Labor precautions and kick counts included in AVS Patient to follow-up with primary ob as scheduled  Patient may return to MAU as needed or when in labor   EmGavin PoundCNNorth [place]/01/2021 6:20 AM

## 2021-03-30 NOTE — MAU Note (Signed)
Pt reports ctx started 0200, have gotten closer and stronger, about 5 min apart. Denies LOF and VB, endorses +FM.

## 2021-04-02 ENCOUNTER — Other Ambulatory Visit: Payer: Self-pay | Admitting: Obstetrics & Gynecology

## 2021-04-03 LAB — SARS CORONAVIRUS 2 (TAT 6-24 HRS): SARS Coronavirus 2: NEGATIVE

## 2021-04-04 ENCOUNTER — Encounter (HOSPITAL_COMMUNITY): Payer: Self-pay | Admitting: Anesthesiology

## 2021-04-04 ENCOUNTER — Encounter (HOSPITAL_COMMUNITY): Payer: Self-pay | Admitting: Obstetrics & Gynecology

## 2021-04-04 ENCOUNTER — Inpatient Hospital Stay (HOSPITAL_COMMUNITY)
Admission: AD | Admit: 2021-04-04 | Discharge: 2021-04-06 | DRG: 806 | Disposition: A | Discharge status: Home / Self Care | Payer: BC Managed Care – PPO | Attending: Obstetrics & Gynecology | Admitting: Obstetrics & Gynecology

## 2021-04-04 ENCOUNTER — Inpatient Hospital Stay (HOSPITAL_COMMUNITY): Payer: BC Managed Care – PPO

## 2021-04-04 ENCOUNTER — Other Ambulatory Visit: Payer: Self-pay

## 2021-04-04 DIAGNOSIS — O9912 Other diseases of the blood and blood-forming organs and certain disorders involving the immune mechanism complicating childbirth: Principal | ICD-10-CM | POA: Diagnosis present

## 2021-04-04 DIAGNOSIS — Z3A39 39 weeks gestation of pregnancy: Secondary | ICD-10-CM | POA: Diagnosis not present

## 2021-04-04 DIAGNOSIS — D6959 Other secondary thrombocytopenia: Secondary | ICD-10-CM | POA: Diagnosis present

## 2021-04-04 DIAGNOSIS — O9902 Anemia complicating childbirth: Secondary | ICD-10-CM | POA: Diagnosis present

## 2021-04-04 DIAGNOSIS — O09523 Supervision of elderly multigravida, third trimester: Secondary | ICD-10-CM | POA: Diagnosis not present

## 2021-04-04 DIAGNOSIS — O99824 Streptococcus B carrier state complicating childbirth: Secondary | ICD-10-CM | POA: Diagnosis not present

## 2021-04-04 DIAGNOSIS — D259 Leiomyoma of uterus, unspecified: Secondary | ICD-10-CM | POA: Diagnosis not present

## 2021-04-04 DIAGNOSIS — I471 Supraventricular tachycardia: Secondary | ICD-10-CM | POA: Diagnosis not present

## 2021-04-04 DIAGNOSIS — R002 Palpitations: Secondary | ICD-10-CM | POA: Diagnosis present

## 2021-04-04 DIAGNOSIS — O3413 Maternal care for benign tumor of corpus uteri, third trimester: Secondary | ICD-10-CM | POA: Diagnosis present

## 2021-04-04 DIAGNOSIS — D509 Iron deficiency anemia, unspecified: Secondary | ICD-10-CM | POA: Diagnosis not present

## 2021-04-04 DIAGNOSIS — O9942 Diseases of the circulatory system complicating childbirth: Secondary | ICD-10-CM | POA: Diagnosis present

## 2021-04-04 DIAGNOSIS — O09529 Supervision of elderly multigravida, unspecified trimester: Secondary | ICD-10-CM

## 2021-04-04 DIAGNOSIS — O26893 Other specified pregnancy related conditions, third trimester: Secondary | ICD-10-CM | POA: Diagnosis not present

## 2021-04-04 LAB — CBC
HCT: 35.6 % — ABNORMAL LOW (ref 36.0–46.0)
HCT: 35 % — ABNORMAL LOW (ref 36.0–46.0)
Hemoglobin: 11.8 g/dL — ABNORMAL LOW (ref 12.0–15.0)
Hemoglobin: 11.4 g/dL — ABNORMAL LOW (ref 12.0–15.0)
MCH: 31.4 pg (ref 26.0–34.0)
MCH: 31 pg (ref 26.0–34.0)
MCHC: 33.1 g/dL (ref 30.0–36.0)
MCHC: 32.6 g/dL (ref 30.0–36.0)
MCV: 94.7 fL (ref 80.0–100.0)
MCV: 95.1 fL (ref 80.0–100.0)
Platelets: 74 10*3/uL — ABNORMAL LOW (ref 150–400)
Platelets: 70 10*3/uL — ABNORMAL LOW (ref 150–400)
RBC: 3.76 MIL/uL — ABNORMAL LOW (ref 3.87–5.11)
RBC: 3.68 MIL/uL — ABNORMAL LOW (ref 3.87–5.11)
RDW: 13.7 % (ref 11.5–15.5)
RDW: 13.6 % (ref 11.5–15.5)
WBC: 9.6 10*3/uL (ref 4.0–10.5)
WBC: 10 10*3/uL (ref 4.0–10.5)
nRBC: 0 % (ref 0.0–0.2)
nRBC: 0 % (ref 0.0–0.2)

## 2021-04-04 LAB — RPR: RPR Ser Ql: NONREACTIVE

## 2021-04-04 LAB — TYPE AND SCREEN
ABO/RH(D): O POS
Antibody Screen: NEGATIVE

## 2021-04-04 MED ORDER — ACETAMINOPHEN 325 MG PO TABS
650.0000 mg | ORAL_TABLET | ORAL | Status: DC | PRN
  Administered 2021-04-04 – 2021-04-06 (×4): 650 mg via ORAL
  Filled 2021-04-04 (×4): qty 2

## 2021-04-04 MED ORDER — BENZOCAINE-MENTHOL 20-0.5 % EX AERO
1.0000 "application " | INHALATION_SPRAY | CUTANEOUS | Status: DC | PRN
  Administered 2021-04-04: 1 via TOPICAL
  Filled 2021-04-04: qty 56

## 2021-04-04 MED ORDER — MISOPROSTOL 25 MCG QUARTER TABLET
25.0000 ug | ORAL_TABLET | ORAL | Status: DC | PRN
  Administered 2021-04-04: 25 ug via VAGINAL
  Filled 2021-04-04: qty 1

## 2021-04-04 MED ORDER — ONDANSETRON HCL 4 MG/2ML IJ SOLN: 4.0000 mg | INTRAMUSCULAR | Status: DC | PRN

## 2021-04-04 MED ORDER — TERBUTALINE SULFATE 1 MG/ML IJ SOLN: 0.2500 mg | Freq: Once | INTRAMUSCULAR | Status: DC | PRN

## 2021-04-04 MED ORDER — LIDOCAINE HCL (PF) 1 % IJ SOLN
30.0000 mL | INTRAMUSCULAR | Status: AC | PRN
  Administered 2021-04-04: 30 mL via SUBCUTANEOUS
  Filled 2021-04-04: qty 30

## 2021-04-04 MED ORDER — SIMETHICONE 80 MG PO CHEW: 80.0000 mg | CHEWABLE_TABLET | ORAL | Status: DC | PRN

## 2021-04-04 MED ORDER — DIPHENHYDRAMINE HCL 25 MG PO CAPS: 25.0000 mg | ORAL_CAPSULE | Freq: Four times a day (QID) | ORAL | Status: DC | PRN

## 2021-04-04 MED ORDER — LACTATED RINGERS IV SOLN: INTRAVENOUS | Status: DC

## 2021-04-04 MED ORDER — SODIUM CHLORIDE 0.9 % IV SOLN
5.0000 10*6.[IU] | Freq: Once | INTRAVENOUS | Status: AC
  Administered 2021-04-04: 5 10*6.[IU] via INTRAVENOUS
  Filled 2021-04-04: qty 5

## 2021-04-04 MED ORDER — SODIUM CHLORIDE 0.9 % IV SOLN
0.3000 ug/kg | Freq: Once | INTRAVENOUS | Status: AC
  Administered 2021-04-04: 24.8 ug via INTRAVENOUS
  Filled 2021-04-04: qty 6.2

## 2021-04-04 MED ORDER — OXYCODONE HCL 5 MG PO TABS: 10.0000 mg | ORAL_TABLET | ORAL | Status: DC | PRN

## 2021-04-04 MED ORDER — SENNOSIDES-DOCUSATE SODIUM 8.6-50 MG PO TABS
2.0000 | ORAL_TABLET | ORAL | Status: DC
  Administered 2021-04-05 – 2021-04-06 (×2): 2 via ORAL
  Filled 2021-04-04 (×2): qty 2

## 2021-04-04 MED ORDER — PENICILLIN G POT IN DEXTROSE 60000 UNIT/ML IV SOLN
3.0000 10*6.[IU] | INTRAVENOUS | Status: DC
  Administered 2021-04-04 (×2): 3 10*6.[IU] via INTRAVENOUS
  Filled 2021-04-04 (×2): qty 50

## 2021-04-04 MED ORDER — OXYTOCIN BOLUS FROM INFUSION
333.0000 mL | Freq: Once | INTRAVENOUS | Status: AC
  Administered 2021-04-04: 333 mL via INTRAVENOUS

## 2021-04-04 MED ORDER — SODIUM CHLORIDE 0.9 % IV SOLN: 250.0000 mL | INTRAVENOUS | Status: DC | PRN

## 2021-04-04 MED ORDER — OXYTOCIN-SODIUM CHLORIDE 30-0.9 UT/500ML-% IV SOLN
1.0000 m[IU]/min | INTRAVENOUS | Status: DC
  Administered 2021-04-04: 2 m[IU]/min via INTRAVENOUS

## 2021-04-04 MED ORDER — ONDANSETRON HCL 4 MG/2ML IJ SOLN: 4.0000 mg | Freq: Four times a day (QID) | INTRAMUSCULAR | Status: DC | PRN

## 2021-04-04 MED ORDER — LACTATED RINGERS IV SOLN: 500.0000 mL | INTRAVENOUS | Status: DC | PRN

## 2021-04-04 MED ORDER — COVID-19 MRNA VAC-TRIS(PFIZER) 30 MCG/0.3ML IM SUSP
0.3000 mL | Freq: Once | INTRAMUSCULAR | Status: DC
  Filled 2021-04-04: qty 0.3

## 2021-04-04 MED ORDER — OXYTOCIN-SODIUM CHLORIDE 30-0.9 UT/500ML-% IV SOLN
2.5000 [IU]/h | INTRAVENOUS | Status: DC
  Filled 2021-04-04: qty 500

## 2021-04-04 MED ORDER — IBUPROFEN 600 MG PO TABS
600.0000 mg | ORAL_TABLET | Freq: Four times a day (QID) | ORAL | Status: DC
  Administered 2021-04-04 – 2021-04-05 (×4): 600 mg via ORAL
  Filled 2021-04-04 (×3): qty 1
  Filled 2021-04-04: qty 3
  Filled 2021-04-04: qty 1

## 2021-04-04 MED ORDER — DIBUCAINE (PERIANAL) 1 % EX OINT: 1.0000 "application " | TOPICAL_OINTMENT | CUTANEOUS | Status: DC | PRN

## 2021-04-04 MED ORDER — COCONUT OIL OIL: 1.0000 "application " | TOPICAL_OIL | Status: DC | PRN

## 2021-04-04 MED ORDER — PRENATAL MULTIVITAMIN CH
1.0000 | ORAL_TABLET | Freq: Every day | ORAL | Status: DC
  Administered 2021-04-05 – 2021-04-06 (×2): 1 via ORAL
  Filled 2021-04-04 (×2): qty 1

## 2021-04-04 MED ORDER — ACETAMINOPHEN 325 MG PO TABS: 650.0000 mg | ORAL_TABLET | ORAL | Status: DC | PRN

## 2021-04-04 MED ORDER — FLEET ENEMA 7-19 GM/118ML RE ENEM: 1.0000 | ENEMA | Freq: Every day | RECTAL | Status: DC | PRN

## 2021-04-04 MED ORDER — OXYCODONE HCL 5 MG PO TABS: 5.0000 mg | ORAL_TABLET | ORAL | Status: DC | PRN

## 2021-04-04 MED ORDER — BISACODYL 10 MG RE SUPP: 10.0000 mg | Freq: Every day | RECTAL | Status: DC | PRN

## 2021-04-04 MED ORDER — ONDANSETRON HCL 4 MG PO TABS: 4.0000 mg | ORAL_TABLET | ORAL | Status: DC | PRN

## 2021-04-04 MED ORDER — SOD CITRATE-CITRIC ACID 500-334 MG/5ML PO SOLN: 30.0000 mL | ORAL | Status: DC | PRN

## 2021-04-04 MED ORDER — SODIUM CHLORIDE 0.9% FLUSH
3.0000 mL | Freq: Two times a day (BID) | INTRAVENOUS | Status: DC
  Administered 2021-04-05 (×2): 3 mL via INTRAVENOUS

## 2021-04-04 MED ORDER — WITCH HAZEL-GLYCERIN EX PADS: 1.0000 "application " | MEDICATED_PAD | CUTANEOUS | Status: DC | PRN

## 2021-04-04 MED ORDER — SODIUM CHLORIDE 0.9% FLUSH: 3.0000 mL | INTRAVENOUS | Status: DC | PRN

## 2021-04-04 MED ORDER — OXYCODONE-ACETAMINOPHEN 5-325 MG PO TABS
1.0000 | ORAL_TABLET | ORAL | Status: DC | PRN
Start: 2021-04-04 — End: 2021-04-04

## 2021-04-04 MED ORDER — FENTANYL CITRATE (PF) 100 MCG/2ML IJ SOLN
50.0000 ug | INTRAMUSCULAR | Status: DC | PRN
  Administered 2021-04-04 (×4): 100 ug via INTRAVENOUS
  Filled 2021-04-04 (×4): qty 2

## 2021-04-04 MED ORDER — OXYCODONE-ACETAMINOPHEN 5-325 MG PO TABS: 2.0000 | ORAL_TABLET | ORAL | Status: DC | PRN

## 2021-04-04 NOTE — Lactation Note (Addendum)
This note was copied from a baby's chart. Lactation Consultation Note  Patient Name: Amanda Mooney [id number] Date: 04/04/2021 Reason for consult: L&D Initial assessment;Mother's request;Term Anemia Age: < 1 hr  LC assisted with latching infant in cross cradle.  Mom denied any pain with the latch.  Mom to receive further LC support on the floor.   Maternal Data Has patient been taught Hand Expression?: Yes Does the patient have breastfeeding experience prior to this delivery?: Yes How long did the patient breastfeed?: 2 children for 1 year  Feeding Mother's Current Feeding Choice: Breast Milk  LATCH Score Latch: Repeated attempts needed to sustain latch, nipple held in mouth throughout feeding, stimulation needed to elicit sucking reflex.  Audible Swallowing: A few with stimulation  Type of Nipple: Flat  Comfort (Breast/Nipple): Soft / non-tender  Hold (Positioning): Assistance needed to correctly position infant at breast and maintain latch.  LATCH Score: 6   Lactation Tools Discussed/Used    Interventions Interventions: Breast feeding basics reviewed;Breast compression;Assisted with latch;Adjust position;Skin to skin;Support pillows;Breast massage;Hand express;Expressed milk;Education  Discharge    Consult Status Consult Status: Follow-up Date: 04/05/21 Follow-up type: In-patient    [name]  Nicholson[place]Springer 04/04/2021, 6:22 PM

## 2021-04-04 NOTE — Progress Notes (Signed)
OBGYN Note O: Vitals:   04/04/21 0931 04/04/21 1020 04/04/21 1135 04/04/21 1218  BP: 126/80 108/64 120/68 125/77  Pulse: 91 100 89 98  Resp:  16 16   Temp:  98.4 F (36.9 C)    TempSrc:  Oral    Weight:      Height:       FHR 140 Cat 1 Toco q28mSVE 2/50/-2, AROM with clear fluid  Recent Labs    04/04/21 0530 04/04/21 1113  WBC 9.6 10.0  HGB 11.8* 11.4*  HCT 35.6* 35.0*  PLT 74* 70*     Amanda Mooney a 40y.o. female GMA:[id number] for term IOL [facility]  IOL: s/p cytotec, now on pit and s/p AROM Gestational thrombocytopenia: last week plt 84, given prednisone '20mg'$  QHS, now plt 74. Discussed with Anesthesia and patient, will do DDAVP this morning and recheck PLT. After DDAVP Plt 70, discussed with patient and reviewed pain mgmt options AMA: normal anatomy, NIPT low risk Palpitations: intermittent, saw cardiology in the past, on propanolol PRN SVT.  Fibroid: anterior 3.4cm fibroid Iron deficient anemia: on PO iron, hgb now 11.8 [name] at 04/04/2021 12:48 PM

## 2021-04-04 NOTE — H&P (Signed)
Amanda Mooney is a 40 y.o. female [phone] [id number] for term I[facility] She reports no LOF, VB. Reports irregular contractions. Normal FM.   Pregnancy c/b: Gestational thrombocytopenia: initial OB plt 315, at 28w 118> 7/6 97 > 7/19 94 > 8/3 84 AMA: normal anatomy, NIPT low risk Palpitations: intermittent, saw cardiology in the past, on propanolol PRN SVT.  Fibroid: anterior 3.4cm fibroid Iron deficient anemia  OB History     Gravida  7   Para  3   Term  1   Preterm  2   AB  3   Living  3      SAB  1   IAB  2   Ectopic      Multiple      Live Births  1          Past Medical History:  Diagnosis Date   Abnormal Pap smear    Anemia    Chlamydia    Complication of anesthesia    hard to arrouse after "twilight" anesthesia   Gestational thrombocytopenia (HCC)    Migraine    PONV (postoperative nausea and vomiting)    Syncope    followed by cardiology   Tachycardia    Vaginal Pap smear, abnormal    Past Surgical History:  Procedure Laterality Date   CERVICAL DISC ARTHROPLASTY N/A 02/01/2019   Procedure: CERVICAL ANTERIOR DISC ARTHROPLASTY  C6-7;  Surgeon: YMeade Maw MD;  Location: [facility] ORS;  Service: Neurosurgery;  Laterality: N/A;   COLPOSCOPY     HERNIA REPAIR     Family History: family history includes Cancer - Prostate in her maternal grandfather; Cirrhosis in her paternal grandfather; Diabetes in her mother; Healthy in her brother, daughter, daughter, maternal grandmother, and son; Heart Problems in her maternal grandfather; Heart attack in her paternal grandmother; Hypertension in her father and mother. Social History:  reports that she has never smoked. She has never used smokeless tobacco. She reports that she does not drink alcohol and does not use drugs.     Maternal Diabetes: No Genetic Screening: Normal NIPT low risk Maternal Ultrasounds/Referrals: Normal Fetal Ultrasounds or other Referrals:  Referred to Materal Fetal  Medicine  - anatomy scan Maternal Substance Abuse:  No Significant Maternal Medications:  Meds include: Other: propranolol Significant Maternal Lab Results:  Group B Strep positive Other Comments:   Gestational thrombocytopenia, AMA, IDA  Review of Systems Per HPI Exam Physical Exam  Dilation: 2 Effacement (%): 60 Station: -2 Exam by:: MAlver FisherB, RN Blood pressure 133/73, pulse 85, temperature 98.5 F (36.9 C), temperature source Oral, resp. rate 16, height '5\' 1"'$  (1.549 m), weight 82.9 kg, last menstrual period 06/25/2020. NAD, resting comfortably, Gravid abdomen Card RRR Pulm CTAB Lower extremity trace edema, no evidence of DVT Fetal testing: 140, Cat 1. Toco q534mrenatal labs: ABO, Rh:  --/--/O POS (08/11 0530) Antibody: NEG (08/11 0530) Rubella: Immune (01/28 0000) RPR: Nonreactive (01/28 0000)  HBsAg: Negative (01/28 0000)  HIV: Non-reactive (01/28 0000)  GBS: Positive/-- (07/29 0000)   Hematology Recent Labs  Lab 04/04/21 0530  WBC 9.6  RBC 3.76*  HGB 11.8*  HCT 35.6*  MCV 94.7  MCH 31.4  MCHC 33.1  RDW 13.7  PLT 74*      Assessment/Plan: DaMEYGAN MCDIVITTs a [age].o. female G7MA:[id number] for term IOL [facility] On admit 2/60/-2, received cyto. Plan for PIT/AROM prn. GBS+ on PCN Gestational thrombocytopenia: last week plt  84, given prednisone '20mg'$  QHS, now plt 74. Discussed with Anesthesia and patient, will do DDAVP this morning and recheck PLT. Patient counseled that plt count may not increase with DDAVP and will need to consider another form of pain management besides epidural. Patient understands.  AMA: normal anatomy, NIPT low risk Palpitations: intermittent, saw cardiology in the past, on propanolol PRN SVT.  Fibroid: anterior 3.4cm fibroid Iron deficient anemia: on PO iron, hgb now 11.8  Efraim Vanallen K Taam-Akelman 04/04/2021, 8:43 AM

## 2021-04-05 LAB — CBC
HCT: 29.9 % — ABNORMAL LOW (ref 36.0–46.0)
Hemoglobin: 10 g/dL — ABNORMAL LOW (ref 12.0–15.0)
MCH: 31.6 pg (ref 26.0–34.0)
MCHC: 33.4 g/dL (ref 30.0–36.0)
MCV: 94.6 fL (ref 80.0–100.0)
Platelets: 55 10*3/uL — ABNORMAL LOW (ref 150–400)
RBC: 3.16 MIL/uL — ABNORMAL LOW (ref 3.87–5.11)
RDW: 13.6 % (ref 11.5–15.5)
WBC: 11.3 10*3/uL — ABNORMAL HIGH (ref 4.0–10.5)
nRBC: 0 % (ref 0.0–0.2)

## 2021-04-05 NOTE — Progress Notes (Signed)
Patient is eating, ambulating, voiding.  Pain control is good.  Vitals:   04/05/21 0820 04/05/21 1418 04/05/21 2200 04/06/21 0500  BP: 130/90 116/73 119/84 113/68  Pulse: 88 99 98 88  Resp: '18 18 19 19  '$ Temp: 99 F (37.2 C) 98.5 F (36.9 C) 98.2 F (36.8 C) 98.3 F (36.8 C)  TempSrc: Oral Oral Oral Oral  SpO2: 100% 100% 99% 98%  Weight:      Height:        Fundus firm Perineum without swelling.  Lab Results  Component Value Date   WBC 9.7 04/06/2021   HGB 9.9 (L) 04/06/2021   HCT 30.7 (L) 04/06/2021   MCV 97.2 04/06/2021   PLT 55 (L) 04/06/2021    --/--/O POS (08/11 0530)/RI  A/P Post partum day 2.  Routine care.  Expect d/c today.    Thrombocytopenia- plt today are again 55.  Stable so will d/c.  If no bleeding over next 4 weeks, pt should had CBC redrawn at pp to check.  Despite refractive to meds, this does appear to be gestational thrombocytopenia. 2.  Parents desires circumsision.  All risks, benefits and alternatives discussed with the mother.   Daria Pastures

## 2021-04-05 NOTE — Discharge Summary (Signed)
Postpartum Discharge Summary  Date of Service updated      Patient Name: Amanda Mooney DOB: [birth year] MRN: [id number]  Date of admission: 04/04/2021 Delivery date:04/04/2021  Delivering provider: Lyda Kalata K  Date of discharge: 04/06/2021  Admitting diagnosis: Advanced maternal age in multigravida [O09.529] Intrauterine pregnancy: [redacted]w[redacted]d    Secondary diagnosis:  Active Problems:   Advanced maternal age in multigravida  Additional problems: Thrombocytopenia    Discharge diagnosis: Term Pregnancy Delivered                                              Post partum procedures: none Augmentation: AROM, Pitocin, and Cytotec Complications: None  Hospital course: Induction of Labor With Vaginal Delivery   40y.o. yo [id number] 370w0das admitted to the hospital 04/04/2021 for induction of labor.  Indication for induction: AMA.  Patient had an uncomplicated labor course as follows: Membrane Rupture Time/Date: 12:44 PM ,04/04/2021   Delivery Method:Vaginal, Spontaneous  Episiotomy: None  Lacerations:  1st degree  Details of delivery can be found in separate delivery note.  Patient had a routine postpartum course. Patient is discharged home 04/06/21.  Newborn Data: Birth date:04/04/2021  Birth time:5:17 PM  Gender:Female  Living status:Living  Apgars:8 ,9  Weight:3544 g   Magnesium Sulfate received: No BMZ received: No Rhophylac:No MMR:No T-DaP:Given prenatally Flu: No Transfusion:No  Physical exam  Vitals:   04/05/21 0820 04/05/21 1418 04/05/21 2200 04/06/21 0500  BP: 130/90 116/73 119/84 113/68  Pulse: 88 99 98 88  Resp: '18 18 19 19  ' Temp: 99 F (37.2 C) 98.5 F (36.9 C) 98.2 F (36.8 C) 98.3 F (36.8 C)  TempSrc: Oral Oral Oral Oral  SpO2: 100% 100% 99% 98%  Weight:      Height:        Labs: Lab Results  Component Value Date   WBC 9.7 04/06/2021   HGB 9.9 (L) 04/06/2021   HCT 30.7 (L) 04/06/2021   MCV 97.2 04/06/2021   PLT 55 (L) 04/06/2021    CMP Latest Ref Rng & Units 11/05/2020  Glucose 70 - 99 mg/dL 79  BUN 6 - 20 mg/dL 12  Creatinine 0.44 - 1.00 mg/dL 0.45  Sodium 135 - 145 mmol/L 136  Potassium 3.5 - 5.1 mmol/L 4.0  Chloride 98 - 111 mmol/L 105  CO2 22 - 32 mmol/L 23  Calcium 8.9 - 10.3 mg/dL 8.6(L)  Total Protein 6.5 - 8.1 g/dL 6.8  Total Bilirubin 0.3 - 1.2 mg/dL 0.5  Alkaline Phos 38 - 126 U/L 45  AST 15 - 41 U/L 17  ALT 0 - 44 U/L 19   Edinburgh Score: Edinburgh Postnatal Depression Scale Screening Tool 04/05/2021  I have been able to laugh and see the funny side of things. 0  I have looked forward with enjoyment to things. 0  I have blamed myself unnecessarily when things went wrong. 1  I have been anxious or worried for no good reason. 2  I have felt scared or panicky for no good reason. 1  Things have been getting on top of me. 1  I have been so unhappy that I have had difficulty sleeping. 0  I have felt sad or miserable. 1  I have been so unhappy that I have been crying. 1  The thought of harming myself has occurred to me. 0  Edinburgh Postnatal Depression Scale Total 7      After visit meds:  Allergies as of 04/06/2021   No Known Allergies      Medication List     STOP taking these medications    predniSONE 20 MG tablet Commonly known as: DELTASONE   propranolol 20 MG tablet Commonly known as: INDERAL       TAKE these medications    butalbital-acetaminophen-caffeine 50-325-40 MG tablet Commonly known as: FIORICET Take 1 tablet by mouth 2 (two) times daily as needed for headache.   cholecalciferol 25 MCG (1000 UNIT) tablet Commonly known as: VITAMIN D3 Take 1,000 Units by mouth daily.   cyclobenzaprine 10 MG tablet Commonly known as: FLEXERIL Take 1 tablet (10 mg total) by mouth 2 (two) times daily as needed for muscle spasms.   PRENATAL AD PO Take 1 tablet by mouth daily at 6 (six) AM.         Discharge home in stable condition Infant Feeding: Breast Infant  Disposition:home with mother Discharge instruction: per After Visit Summary and Postpartum booklet. Activity: Advance as tolerated. Pelvic rest for 6 weeks.  Diet: routine diet Anticipated Birth Control: Unsure Postpartum Appointment:4 weeks Additional Postpartum F/U:  none Future Appointments: Future Appointments  Date Time Provider [place]  06/11/2021  3:00 PM Tobb, Godfrey Pick, DO [facility]   Follow up Visit:  Follow-up Information     Bobbye Charleston, MD Follow up in 4 week(s).   Specialty: Obstetrics and Gynecology Contact information: [address] [place]. [place] [address] [phone]                     04/06/2021 Daria Pastures, MD

## 2021-04-05 NOTE — Lactation Note (Signed)
This note was copied from a baby's chart. Lactation Consultation Note  Patient Name: Amanda Mooney [id number] Date: 04/05/2021 Reason for consult: Initial assessment;Term Age:38 hours   P4 mother whose infant is now 52 hours old.  This is a term baby at 39+0 weeks.  Mother did not breast feed her first child but breast fed her second and third children for one year each.  Baby awake; mother interested in having assistance with latching.  Upon assessment, mother's breasts are soft and non tender and nipples are inverted.  Mother reported using a NS for a few weeks with both of her other children.  Assisted to latch in the football hold, however, baby was unable to maintain a deep latch or suck.  Mother requested a NS.  When I returned she informed me that she was able to get him latched for a few minutes.  He was not latched when I returned and is sleepy now.  Informed mother that I will size her for a NS with the next feeding.  Mother will call her RN for my return with sizing.  Suggested she continue hand expression and to feed back any EBM she obtains to baby.    Mom made aware of O/P services, breastfeeding support groups, community resources, and our phone # for post-discharge questions.    RN updated.   Maternal Data Has patient been taught Hand Expression?: Yes Does the patient have breastfeeding experience prior to this delivery?: Yes How long did the patient breastfeed?: one year each with her last two children  Feeding Mother's Current Feeding Choice: Breast Milk  LATCH Score Latch: Too sleepy or reluctant, no latch achieved, no sucking elicited.  Audible Swallowing: None  Type of Nipple: Inverted  Comfort (Breast/Nipple): Soft / non-tender  Hold (Positioning): Assistance needed to correctly position infant at breast and maintain latch.  LATCH Score: 3   Lactation Tools Discussed/Used    Interventions Interventions: Breast feeding basics reviewed;Assisted with  latch;Skin to skin;Breast massage;Hand express;Adjust position;Position options;Support pillows;Education  Discharge Pump: Personal  Consult Status Consult Status: Follow-up Date: 04/06/21 Follow-up type: In-patient    [name] R [name] 04/05/2021, 7:42 AM

## 2021-04-05 NOTE — Progress Notes (Signed)
Patient is eating, ambulating, voiding.  Pain control is good.  Vitals:   04/04/21 1906 04/04/21 2007 04/04/21 2356 04/05/21 0413  BP: 127/75 125/67 130/79 100/71  Pulse: (!) 114 (!) 105 98 71  Resp: '18 19  18  '$ Temp: 98.3 F (36.8 C) 98.5 F (36.9 C)  98.5 F (36.9 C)  TempSrc: Oral Oral  Oral  SpO2: 99% 98%  99%  Weight:      Height:        Fundus firm Perineum without swelling.  Lab Results  Component Value Date   WBC 11.3 (H) 04/05/2021   HGB 10.0 (L) 04/05/2021   HCT 29.9 (L) 04/05/2021   MCV 94.6 04/05/2021   PLT 55 (L) 04/05/2021    --/--/O POS (08/11 0530)/RI  A/P Post partum day 1.  Routine care.  Expect d/c tomorrow.    Thrombocytopenia without bleeding.  Plt now 55 despite steroids and DDAVP.  REcheck tomorrow- if not stabilized will get Hematology consult.   Circ desired- baby not voided yet.    Amanda Mooney

## 2021-04-06 LAB — CBC WITH DIFFERENTIAL/PLATELET
Abs Immature Granulocytes: 0.16 10*3/uL — ABNORMAL HIGH (ref 0.00–0.07)
Basophils Absolute: 0 10*3/uL (ref 0.0–0.1)
Basophils Relative: 0 %
Eosinophils Absolute: 0.1 10*3/uL (ref 0.0–0.5)
Eosinophils Relative: 1 %
HCT: 30.7 % — ABNORMAL LOW (ref 36.0–46.0)
Hemoglobin: 9.9 g/dL — ABNORMAL LOW (ref 12.0–15.0)
Immature Granulocytes: 2 %
Lymphocytes Relative: 21 %
Lymphs Abs: 2.1 10*3/uL (ref 0.7–4.0)
MCH: 31.3 pg (ref 26.0–34.0)
MCHC: 32.2 g/dL (ref 30.0–36.0)
MCV: 97.2 fL (ref 80.0–100.0)
Monocytes Absolute: 0.8 10*3/uL (ref 0.1–1.0)
Monocytes Relative: 8 %
Neutro Abs: 6.5 10*3/uL (ref 1.7–7.7)
Neutrophils Relative %: 68 %
Platelets: 55 10*3/uL — ABNORMAL LOW (ref 150–400)
RBC: 3.16 MIL/uL — ABNORMAL LOW (ref 3.87–5.11)
RDW: 13.6 % (ref 11.5–15.5)
WBC: 9.7 10*3/uL (ref 4.0–10.5)
nRBC: 0 % (ref 0.0–0.2)

## 2021-04-06 NOTE — Lactation Note (Signed)
This note was copied from a baby's chart. Lactation Consultation Note  Patient Name: Boy Kyliyah Joos [id number] Date: 04/06/2021 Reason for consult: Follow-up assessment;Term Age:38 hours  Mom states her right nipple is painful. She does feel infant is feeding well.    LC assessed nipple and redness is visible over the nipple. Mom states she has inverted nipples.  Nipples appear flat but easily compressible.    LC provided hand pump and suggested to hand express and pre pump prior to latching for added comfort.  Infant was sleeping and recently fed so feeding/latch unable to be assessed prior to DC.   Comfort gels also given to mom.  DC teaching provided and all of mom's questions and concerns were addressed.     Maternal Data    Feeding Mother's Current Feeding Choice: Breast Milk  LATCH Score                    Lactation Tools Discussed/Used Tools: Pump;Comfort gels Breast pump type: Manual  Interventions Interventions: Breast feeding basics reviewed;Education;Pre-pump if needed  Discharge Discharge Education: Warning signs for feeding baby;Engorgement and breast care Pump: Manual  Consult Status Consult Status: Complete Date: 04/06/21 Follow-up type: In-patient    Ferne Coe [facility] 04/06/2021, 11:59 AM

## 2021-04-18 ENCOUNTER — Telehealth (HOSPITAL_COMMUNITY): Payer: Self-pay | Admitting: *Deleted

## 2021-04-18 NOTE — Telephone Encounter (Signed)
Patient voiced no questions or concerns regarding her own health. EPDS = 5. Patient voiced no questions or concerns regarding baby at this time. Patient reported infant sleeps in a bassinet on his back. RN reviewed ABCs of safe sleep - patient verbalized understanding. Patient requested RN email information on hospital's virtual breastfeeding and pumping support groups. Email sent. Erline Levine, RN 04/18/21, 1739.

## 2021-05-07 DIAGNOSIS — D696 Thrombocytopenia, unspecified: Secondary | ICD-10-CM | POA: Diagnosis not present

## 2021-06-11 ENCOUNTER — Telehealth: Payer: Self-pay

## 2021-06-11 ENCOUNTER — Other Ambulatory Visit: Payer: Self-pay

## 2021-06-11 ENCOUNTER — Telehealth (INDEPENDENT_AMBULATORY_CARE_PROVIDER_SITE_OTHER): Payer: BC Managed Care – PPO | Admitting: Cardiology

## 2021-06-11 ENCOUNTER — Encounter (HOSPITAL_BASED_OUTPATIENT_CLINIC_OR_DEPARTMENT_OTHER): Payer: Self-pay | Admitting: Obstetrics and Gynecology

## 2021-06-11 DIAGNOSIS — O165 Unspecified maternal hypertension, complicating the puerperium: Secondary | ICD-10-CM

## 2021-06-11 DIAGNOSIS — I471 Supraventricular tachycardia: Secondary | ICD-10-CM | POA: Diagnosis not present

## 2021-06-11 MED ORDER — VALSARTAN 160 MG PO TABS: 160.0000 mg | ORAL_TABLET | Freq: Every day | ORAL | 3 refills | Status: DC

## 2021-06-11 NOTE — Telephone Encounter (Deleted)
I connected with  Amanda Mooney on 06/11/21 by a video enabled telemedicine application and verified that I am speaking with the correct person using two identifiers.   I discussed the limitations of evaluation and management by telemedicine. The patient expressed understanding and agreed to proceed.

## 2021-06-11 NOTE — Patient Instructions (Signed)
  Medication Instructions:  Your physician has recommended you make the following change in your medication:  STOP: Losartan START: Valsartan 160 mg once daily Please get an upper arm blood pressure cuff. Take blood pressure daily and record. Reach out to the office if your blood pressure is greater than 150/100 for 2 days in a row.  *If you need a refill on your cardiac medications before your next appointment, please call your pharmacy*   Lab Work: None If you have labs (blood work) drawn today and your tests are completely normal, you will receive your results only by: [place] (if you have MyChart) OR A paper copy in the mail If you have any lab test that is abnormal or we need to change your treatment, we will call you to review the results.   Testing/Procedures: None   Follow-Up: At [facility], you and your health needs are our priority.  As part of our continuing mission to provide you with exceptional heart care, we have created designated Provider Care Teams.  These Care Teams include your primary Cardiologist (physician) and Advanced Practice Providers (APPs -  Physician Assistants and Nurse Practitioners) who all work together to provide you with the care you need, when you need it.  We recommend signing up for the patient portal called "MyChart".  Sign up information is provided on this After Visit Summary.  MyChart is used to connect with patients for Virtual Visits (Telemedicine).  Patients are able to view lab/test results, encounter notes, upcoming appointments, etc.  Non-urgent messages can be sent to your provider as well.   To learn more about what you can do with MyChart, go to [email or web address].    Your next appointment:   October 28th  The format for your next appointment:   In Person  Provider:   Berniece Salines  [facility] [address] [place], [place] [address]    Other Instructions

## 2021-06-11 NOTE — Progress Notes (Signed)
[facility]  Follow Up Note   Date:  06/11/2021   ID:  Amanda Mooney, Amanda Mooney October 23, 1980, MRN [id number]  PCP:  London Pepper, MD   [facility] Providers Cardiologist:  Berniece Salines, DO  Electrophysiologist:  None        Referring MD: London Pepper, MD   Chief Complaint: " my blood pressure has been elevated"  Virtual Visit via Video  Note . I connected with the patient today by a   video enabled telemedicine application and verified that I am speaking with the correct person using two identifiers.  The patient is at home. I am in the office.  History of Present Illness:    Amanda Mooney is a 40 y.o. female [[id number]] who returns for follow up of post partum visit.  She has a medical history of of paroxysmal SVT which was diagnosed about 10 to 15 years ago, at that time the patient tells me that she was considered for ablation but first she was giving some metoprolol which helped in ablation was not pursued.     Of note did tell me at her initial visit that her last 2 pregnancies she has had multiple syncope episodes with the most recent pregnancy in her 86-year-old when she had 2 syncope episodes most of which she feels lightheaded, some palpitations dizziness and most times if she does not lie flat she would pass out.  Recently she has been able to control the syncope episodes by just lying flat when she experiences the symptoms.   During her visit given her palpitations and history of SVT I placed a monitor on the patient as well as get an echocardiogram.   I last saw the patient on November 14, 2020 at that time we continued her propanolol as needed.  Since I saw the patient she tells me that she has not had any syncope episodes and her propanolol has been aborting her SVT.  No other complaints at this time.   I saw the patient on March 25, 2021 at that time she was doing well from a cardiovascular standpoint.  Since I saw the patient she has delivered and has been  experiencing postpartum hypertension.  She is currently was on labetalol 200 mg daily.  And losartan 200 mg daily.  She tells me that her blood pressure at home with systolic has been going into the 190s.  She is using a wrist cuff to get her blood pressure measurements.  I have advised the patient to get her arm cuff.  As she tells me that her blood pressure in her doctor's office was also normal which makes me consider that the device may be inaccurately reading her blood pressure as well.  Prior CV Studies Reviewed: The following studies were reviewed today:  Transthoracic echocardiogram November 02, 2020 IMPRESSIONS     1. Left ventricular ejection fraction, by estimation, is 60 to 65%. The  left ventricle has normal function. The left ventricle has no regional  wall motion abnormalities. Left ventricular diastolic parameters were  normal.   2. Right ventricular systolic function is normal. The right ventricular  size is normal. There is normal pulmonary artery systolic pressure.   3. The mitral valve is normal in structure. No evidence of mitral valve  regurgitation. No evidence of mitral stenosis.   4. The aortic valve is normal in structure. Aortic valve regurgitation is  not visualized. No aortic stenosis is present.   5. The inferior vena cava is normal  in size with greater than 50%  respiratory variability, suggesting right atrial pressure of 3 mmHg.   FINDINGS   Left Ventricle: Left ventricular ejection fraction, by estimation, is 60  to 65%. The left ventricle has normal function. The left ventricle has no  regional wall motion abnormalities. The left ventricular internal cavity  size was normal in size. There is   no left ventricular hypertrophy. Left ventricular diastolic parameters  were normal.   Right Ventricle: The right ventricular size is normal. No increase in  right ventricular wall thickness. Right ventricular systolic function is  normal. There is normal  pulmonary artery systolic pressure. The tricuspid  regurgitant velocity is 2.35 m/s, and   with an assumed right atrial pressure of 3 mmHg, the estimated right  ventricular systolic pressure is 82.5 mmHg.   Left Atrium: Left atrial size was normal in size.   Right Atrium: Right atrial size was normal in size.   Pericardium: There is no evidence of pericardial effusion.   Mitral Valve: The mitral valve is normal in structure. No evidence of  mitral valve regurgitation. No evidence of mitral valve stenosis.   Tricuspid Valve: The tricuspid valve is normal in structure. Tricuspid  valve regurgitation is trivial. No evidence of tricuspid stenosis.   Aortic Valve: The aortic valve is normal in structure. Aortic valve  regurgitation is not visualized. No aortic stenosis is present.   Pulmonic Valve: The pulmonic valve was normal in structure. Pulmonic valve  regurgitation is not visualized. No evidence of pulmonic stenosis.   Aorta: The aortic root is normal in size and structure.   Venous: The inferior vena cava is normal in size with greater than 50%  respiratory variability, suggesting right atrial pressure of 3 mmHg.   IAS/Shunts: No atrial level shunt detected by color flow Doppler.   ZIO monitor The patient wore the monitor for 14 days starting October 08, 2020. Indication: Syncope   The minimum heart rate was 75 bpm, maximum heart rate was 156 bpm, and average heart rate was 101 bpm. Predominant underlying rhythm was Sinus Rhythm.     Premature atrial complexes were rare less than 1%. Premature Ventricular complexes were rare less than 1%.   No atrial tachycardia, no pauses, no supraventricular tachycardia, no AV block and no atrial fibrillation present.   2 patient triggered events associated with sinus rhythm and sinus tachycardia.   Conclusion: Normal/unremarkable study.    Past Medical History:  Diagnosis Date   Anemia    Complication of anesthesia    " very  hard to wake"   Gestational thrombocytopenia ([place])    H/O syncope 09/2020   followed by cardiology-- dr Raliegh Ip. Saide Lanuza;    during pregnancy ;  echo and  event monitor in epic 03/ 2022 ;  (06-11-2021 pt stated no syncopal or near syncope since 02/ 2022)   History of chlamydia    Hx of abnormal cervical Pap smear    Hypertension    Migraine    PONV (postoperative nausea and vomiting)    PSVT (paroxysmal supraventricular tachycardia) (HCC)    takes propranolol prn for palpitations   Wears glasses     Past Surgical History:  Procedure Laterality Date   CERVICAL DISC ARTHROPLASTY N/A 02/01/2019   Procedure: CERVICAL ANTERIOR DISC ARTHROPLASTY  C6-7;  Surgeon: Meade Maw, MD;  Location: [facility] ORS;  Service: Neurosurgery;  Laterality: N/A;   DILATION AND CURETTAGE OF UTERUS  2007   HERNIA REPAIR     infant  (  type unknown)      OB History     Gravida  7   Para  4   Term  2   Preterm  2   AB  3   Living  4      SAB  1   IAB  2   Ectopic      Multiple  0   Live Births  2               Current Medications: Current Meds  Medication Sig   butalbital-acetaminophen-caffeine (FIORICET) 50-325-40 MG tablet Take 1 tablet by mouth 2 (two) times daily as needed for headache.   labetalol (NORMODYNE) 200 MG tablet Take 200 mg by mouth daily.   Prenatal Vit-DSS-Fe Cbn-FA (PRENATAL AD PO) Take 1 tablet by mouth daily at 6 (six) AM.   propranolol (INDERAL) 20 MG tablet Take 20 mg by mouth 3 (three) times daily as needed.   valsartan (DIOVAN) 160 MG tablet Take 1 tablet (160 mg total) by mouth daily.   [DISCONTINUED] losartan (COZAAR) 100 MG tablet Take 200 mg by mouth daily.     Allergies:   Patient has no known allergies.   Social History   Socioeconomic History   Marital status: Married    Spouse name: Not on file   Number of children: Not on file   Years of education: Not on file   Highest education level: Not on file  Occupational History   Not on file   Tobacco Use   Smoking status: Never   Smokeless tobacco: Never  Vaping Use   Vaping Use: Never used  Substance and Sexual Activity   Alcohol use: No   Drug use: No   Sexual activity: Yes    Birth control/protection: None  Other Topics Concern   Not on file  Social History Narrative   ** Merged History Encounter **       Social Determinants of Health   Financial Resource Strain: Not on file  Food Insecurity: Not on file  Transportation Needs: Not on file  Physical Activity: Not on file  Stress: Not on file  Social Connections: Not on file      Family History  Problem Relation Age of Onset   Diabetes Mother    Hypertension Mother    Hypertension Father    Healthy Maternal Grandmother    Cancer - Prostate Maternal Grandfather    Heart Problems Maternal Grandfather    Heart attack Paternal Grandmother    Cirrhosis Paternal Biochemist, clinical    Healthy Daughter    Healthy Daughter    Healthy Son       ROS:   Please see the history of present illness.     All other systems reviewed and are negative.   Labs/EKG Reviewed:    EKG:   EKG is was not ordered today.   Recent Labs: 11/05/2020: ALT 19; BUN 12; Creatinine, Ser 0.45; Potassium 4.0; Sodium 136 04/06/2021: Hemoglobin 9.9; Platelets 55   Recent Lipid Panel No results found for: CHOL, TRIG, HDL, CHOLHDL, LDLCALC, LDLDIRECT  Physical Exam:    VS:  LMP 06/25/2020     Wt Readings from Last 3 Encounters:  04/04/21 182 lb 11.2 oz (82.9 kg)  03/25/21 179 lb 0.6 oz (81.2 kg)  11/14/20 165 lb (74.8 kg)     Virtual visit no physical exam performed  Risk Assessment/Risk Calculators:  ASSESSMENT & PLAN:    Postpartum hypertension Paroxysmal SVT  We have to monitor the patient closely giving her postpartum hypertension.  She is on labetalol 200 mg daily.  And is on losartan 200 mg.  I like to stop the losartan and place the patient on valsartan 160 mg daily.  We will  keep her labetalol dose with no change for now.  I have  asked the patient to take her blood pressure daily and send me that information in the next several days.  I have encouraged the patient to get a different blood pressure monitor which will take her blood pressure in the arm for accuracy.  She had questions about discrepant blood pressure reading on separate times.  We will keep an eye on this at her follow-up visit which will be in person we will take her blood pressure in separate arms to verify this.  No reports of any palpitations ,lightheadedness or syncope episodes.  Total time spent with the virtual visit 10 minutes.   Patient Instructions   Medication Instructions:  Your physician has recommended you make the following change in your medication:  STOP: Losartan START: Valsartan 160 mg once daily Please get an upper arm blood pressure cuff. Take blood pressure daily and record. Reach out to the office if your blood pressure is greater than 150/100 for 2 days in a row.  *If you need a refill on your cardiac medications before your next appointment, please call your pharmacy*   Lab Work: None If you have labs (blood work) drawn today and your tests are completely normal, you will receive your results only by: [place] (if you have MyChart) OR A paper copy in the mail If you have any lab test that is abnormal or we need to change your treatment, we will call you to review the results.   Testing/Procedures: None   Follow-Up: At [facility], you and your health needs are our priority.  As part of our continuing mission to provide you with exceptional heart care, we have created designated Provider Care Teams.  These Care Teams include your primary Cardiologist (physician) and Advanced Practice Providers (APPs -  Physician Assistants and Nurse Practitioners) who all work together to provide you with the care you need, when you need it.  We recommend signing up for  the patient portal called "MyChart".  Sign up information is provided on this After Visit Summary.  MyChart is used to connect with patients for Virtual Visits (Telemedicine).  Patients are able to view lab/test results, encounter notes, upcoming appointments, etc.  Non-urgent messages can be sent to your provider as well.   To learn more about what you can do with MyChart, go to [email or web address].    Your next appointment:   October 28th  The format for your next appointment:   In Person  Provider:   Berniece Salines  [facility] [address], [place], [place] [address]    Other Instructions   Dispo:  No follow-ups on file.   Medication Adjustments/Labs and Tests Ordered: Current medicines are reviewed at length with the patient today.  Concerns regarding medicines are outlined above.  Tests Ordered: No orders of the defined types were placed in this encounter.  Medication Changes: Meds ordered this encounter  Medications   valsartan (DIOVAN) 160 MG tablet    Sig: Take 1 tablet (160 mg total) by mouth daily.    Dispense:  90 tablet    Refill:  3

## 2021-06-11 NOTE — Progress Notes (Signed)
Spoke w/ via phone for pre-op interview---pt Lab needs dos----  [facility], urine preg, t&s              Lab results------ current ekg in epic/ chart COVID test -----patient states asymptomatic no test needed Arrive at ------- 1015 on 06-14-2021 NPO after MN NO Solid Food.  Clear liquids from MN until--- 0915 Med rec completed Medications to take morning of surgery ----- labetalol, may take propranolol / fioricet if needed Diabetic medication ----- Patient instructed no nail polish to be worn day of surgery Patient instructed to bring photo id and insurance card day of surgery Patient aware to have Driver (ride ) / caregiver for 24 hours after surgery --husband, gary Patient Special Instructions ----- n/a Pre-Op special Istructions ----- n/a Patient verbalized understanding of instructions that were given at this phone interview. Patient denies shortness of breath, chest pain, fever, cough at this phone interview.

## 2021-06-11 NOTE — Telephone Encounter (Signed)
  Patient Consent for Virtual Visit        Amanda Mooney has provided verbal consent on 06/11/2021 for a virtual visit (video or telephone).   CONSENT FOR VIRTUAL VISIT FOR:  Amanda Mooney  By participating in this virtual visit I agree to the following:  I hereby voluntarily request, consent and authorize [facility] and its employed or contracted physicians, physician assistants, nurse practitioners or other licensed health care professionals (the Practitioner), to provide me with telemedicine health care services (the "Services") as deemed necessary by the treating Practitioner. I acknowledge and consent to receive the Services by the Practitioner via telemedicine. I understand that the telemedicine visit will involve communicating with the Practitioner through live audiovisual communication technology and the disclosure of certain medical information by electronic transmission. I acknowledge that I have been given the opportunity to request an in-person assessment or other available alternative prior to the telemedicine visit and am voluntarily participating in the telemedicine visit.  I understand that I have the right to withhold or withdraw my consent to the use of telemedicine in the course of my care at any time, without affecting my right to future care or treatment, and that the Practitioner or I may terminate the telemedicine visit at any time. I understand that I have the right to inspect all information obtained and/or recorded in the course of the telemedicine visit and may receive copies of available information for a reasonable fee.  I understand that some of the potential risks of receiving the Services via telemedicine include:  Delay or interruption in medical evaluation due to technological equipment failure or disruption; Information transmitted may not be sufficient (e.g. poor resolution of images) to allow for appropriate medical decision making by the Practitioner; and/or   In rare instances, security protocols could fail, causing a breach of personal health information.  Furthermore, I acknowledge that it is my responsibility to provide information about my medical history, conditions and care that is complete and accurate to the best of my ability. I acknowledge that Practitioner's advice, recommendations, and/or decision may be based on factors not within their control, such as incomplete or inaccurate data provided by me or distortions of diagnostic images or specimens that may result from electronic transmissions. I understand that the practice of medicine is not an exact science and that Practitioner makes no warranties or guarantees regarding treatment outcomes. I acknowledge that a copy of this consent can be made available to me via my patient portal ([facility]), or I can request a printed copy by calling the office of [facility].    I understand that my insurance will be billed for this visit.   I have read or had this consent read to me. I understand the contents of this consent, which adequately explains the benefits and risks of the Services being provided via telemedicine.  I have been provided ample opportunity to ask questions regarding this consent and the Services and have had my questions answered to my satisfaction. I give my informed consent for the services to be provided through the use of telemedicine in my medical care

## 2021-06-12 ENCOUNTER — Ambulatory Visit (HOSPITAL_BASED_OUTPATIENT_CLINIC_OR_DEPARTMENT_OTHER): Admit: 2021-06-12 | Payer: BC Managed Care – PPO | Admitting: Obstetrics and Gynecology

## 2021-06-12 ENCOUNTER — Encounter (HOSPITAL_BASED_OUTPATIENT_CLINIC_OR_DEPARTMENT_OTHER): Payer: Self-pay

## 2021-06-12 SURGERY — SALPINGECTOMY, BILATERAL, LAPAROSCOPIC
Anesthesia: General | Laterality: Bilateral

## 2021-06-14 ENCOUNTER — Ambulatory Visit (HOSPITAL_BASED_OUTPATIENT_CLINIC_OR_DEPARTMENT_OTHER): Payer: BC Managed Care – PPO | Admitting: Anesthesiology

## 2021-06-14 ENCOUNTER — Encounter (HOSPITAL_BASED_OUTPATIENT_CLINIC_OR_DEPARTMENT_OTHER)
Admission: RE | Disposition: A | Discharge status: Home / Self Care | Payer: Self-pay | Source: Home / Self Care | Attending: Obstetrics and Gynecology

## 2021-06-14 ENCOUNTER — Encounter (HOSPITAL_BASED_OUTPATIENT_CLINIC_OR_DEPARTMENT_OTHER): Payer: Self-pay | Admitting: Obstetrics and Gynecology

## 2021-06-14 ENCOUNTER — Ambulatory Visit (HOSPITAL_BASED_OUTPATIENT_CLINIC_OR_DEPARTMENT_OTHER)
Admission: RE | Admit: 2021-06-14 | Discharge: 2021-06-14 | Disposition: A | Discharge status: Home / Self Care | Payer: BC Managed Care – PPO | Attending: Obstetrics and Gynecology | Admitting: Obstetrics and Gynecology

## 2021-06-14 DIAGNOSIS — Z302 Encounter for sterilization: Secondary | ICD-10-CM | POA: Diagnosis not present

## 2021-06-14 DIAGNOSIS — Z79899 Other long term (current) drug therapy: Secondary | ICD-10-CM | POA: Insufficient documentation

## 2021-06-14 DIAGNOSIS — G43909 Migraine, unspecified, not intractable, without status migrainosus: Secondary | ICD-10-CM | POA: Diagnosis not present

## 2021-06-14 DIAGNOSIS — I471 Supraventricular tachycardia: Secondary | ICD-10-CM | POA: Diagnosis not present

## 2021-06-14 DIAGNOSIS — D649 Anemia, unspecified: Secondary | ICD-10-CM | POA: Diagnosis not present

## 2021-06-14 HISTORY — DX: Supraventricular tachycardia: I47.1

## 2021-06-14 HISTORY — PX: LAPAROSCOPIC BILATERAL SALPINGECTOMY: SHX5889

## 2021-06-14 HISTORY — DX: Personal history of other infectious and parasitic diseases: Z86.19

## 2021-06-14 HISTORY — DX: Presence of spectacles and contact lenses: Z97.3

## 2021-06-14 HISTORY — DX: Essential (primary) hypertension: I10

## 2021-06-14 HISTORY — DX: Supraventricular tachycardia, unspecified: I47.10

## 2021-06-14 HISTORY — DX: Personal history of other diseases of the female genital tract: Z87.42

## 2021-06-14 LAB — POCT I-STAT, CHEM 8
BUN: 9 mg/dL (ref 6–20)
Calcium, Ion: 1.2 mmol/L (ref 1.15–1.40)
Chloride: 104 mmol/L (ref 98–111)
Creatinine, Ser: 0.6 mg/dL (ref 0.44–1.00)
Glucose, Bld: 77 mg/dL (ref 70–99)
HCT: 39 % (ref 36.0–46.0)
Hemoglobin: 13.3 g/dL (ref 12.0–15.0)
Potassium: 3.3 mmol/L — ABNORMAL LOW (ref 3.5–5.1)
Sodium: 143 mmol/L (ref 135–145)
TCO2: 25 mmol/L (ref 22–32)

## 2021-06-14 LAB — TYPE AND SCREEN
ABO/RH(D): O POS
Antibody Screen: NEGATIVE

## 2021-06-14 LAB — POCT PREGNANCY, URINE: Preg Test, Ur: NEGATIVE

## 2021-06-14 SURGERY — SALPINGECTOMY, BILATERAL, LAPAROSCOPIC
Anesthesia: General | Laterality: Bilateral

## 2021-06-14 MED ORDER — ONDANSETRON HCL 4 MG/2ML IJ SOLN
INTRAMUSCULAR | Status: DC | PRN
  Administered 2021-06-14: 4 mg via INTRAVENOUS

## 2021-06-14 MED ORDER — ROCURONIUM BROMIDE 100 MG/10ML IV SOLN
INTRAVENOUS | Status: DC | PRN
  Administered 2021-06-14: 50 mg via INTRAVENOUS

## 2021-06-14 MED ORDER — IBUPROFEN 200 MG PO TABS: 600.0000 mg | ORAL_TABLET | Freq: Four times a day (QID) | ORAL | Status: AC | PRN

## 2021-06-14 MED ORDER — ROCURONIUM BROMIDE 10 MG/ML (PF) SYRINGE
PREFILLED_SYRINGE | INTRAVENOUS | Status: AC
  Filled 2021-06-14: qty 10

## 2021-06-14 MED ORDER — LACTATED RINGERS IV SOLN: INTRAVENOUS | Status: DC

## 2021-06-14 MED ORDER — OXYCODONE HCL 5 MG PO TABS
ORAL_TABLET | ORAL | Status: AC
  Filled 2021-06-14: qty 1

## 2021-06-14 MED ORDER — PROPOFOL 10 MG/ML IV BOLUS
INTRAVENOUS | Status: AC
  Filled 2021-06-14: qty 20

## 2021-06-14 MED ORDER — OXYCODONE HCL 5 MG PO TABS
5.0000 mg | ORAL_TABLET | Freq: Once | ORAL | Status: AC | PRN
  Administered 2021-06-14: 5 mg via ORAL

## 2021-06-14 MED ORDER — BUPIVACAINE HCL (PF) 0.25 % IJ SOLN
INTRAMUSCULAR | Status: DC | PRN
  Administered 2021-06-14: 30 mL

## 2021-06-14 MED ORDER — DEXAMETHASONE SODIUM PHOSPHATE 10 MG/ML IJ SOLN
INTRAMUSCULAR | Status: AC
  Filled 2021-06-14: qty 1

## 2021-06-14 MED ORDER — FENTANYL CITRATE (PF) 100 MCG/2ML IJ SOLN
INTRAMUSCULAR | Status: DC | PRN
  Administered 2021-06-14: 50 ug via INTRAVENOUS
  Administered 2021-06-14: 25 ug via INTRAVENOUS

## 2021-06-14 MED ORDER — SUGAMMADEX SODIUM 200 MG/2ML IV SOLN
INTRAVENOUS | Status: DC | PRN
  Administered 2021-06-14: 140 mg via INTRAVENOUS

## 2021-06-14 MED ORDER — DEXAMETHASONE SODIUM PHOSPHATE 4 MG/ML IJ SOLN
INTRAMUSCULAR | Status: DC | PRN
  Administered 2021-06-14: 8 mg via INTRAVENOUS

## 2021-06-14 MED ORDER — ONDANSETRON HCL 4 MG/2ML IJ SOLN
INTRAMUSCULAR | Status: AC
  Filled 2021-06-14: qty 2

## 2021-06-14 MED ORDER — LIDOCAINE 2% (20 MG/ML) 5 ML SYRINGE
INTRAMUSCULAR | Status: AC
  Filled 2021-06-14: qty 5

## 2021-06-14 MED ORDER — FENTANYL CITRATE (PF) 100 MCG/2ML IJ SOLN: 25.0000 ug | INTRAMUSCULAR | Status: DC | PRN

## 2021-06-14 MED ORDER — SILVER NITRATE-POT NITRATE 75-25 % EX MISC
CUTANEOUS | Status: AC
  Filled 2021-06-14: qty 30

## 2021-06-14 MED ORDER — SCOPOLAMINE 1 MG/3DAYS TD PT72
1.0000 | MEDICATED_PATCH | TRANSDERMAL | Status: DC
  Administered 2021-06-14: 1.5 mg via TRANSDERMAL

## 2021-06-14 MED ORDER — SCOPOLAMINE 1 MG/3DAYS TD PT72
MEDICATED_PATCH | TRANSDERMAL | Status: AC
  Filled 2021-06-14: qty 1

## 2021-06-14 MED ORDER — FENTANYL CITRATE (PF) 100 MCG/2ML IJ SOLN
INTRAMUSCULAR | Status: AC
  Filled 2021-06-14: qty 2

## 2021-06-14 MED ORDER — OXYCODONE HCL 5 MG/5ML PO SOLN
5.0000 mg | Freq: Once | ORAL | Status: AC | PRN
Start: 2021-06-14 — End: 2021-06-14

## 2021-06-14 MED ORDER — KETOROLAC TROMETHAMINE 30 MG/ML IJ SOLN
INTRAMUSCULAR | Status: DC | PRN
  Administered 2021-06-14: 30 mg via INTRAVENOUS

## 2021-06-14 MED ORDER — ACETAMINOPHEN 10 MG/ML IV SOLN: 1000.0000 mg | Freq: Once | INTRAVENOUS | Status: DC | PRN

## 2021-06-14 MED ORDER — KETOROLAC TROMETHAMINE 15 MG/ML IJ SOLN: 15.0000 mg | INTRAMUSCULAR | Status: DC

## 2021-06-14 MED ORDER — ACETAMINOPHEN 500 MG PO TABS
ORAL_TABLET | ORAL | Status: AC
  Filled 2021-06-14: qty 2

## 2021-06-14 MED ORDER — ACETAMINOPHEN 325 MG PO TABS: 325.0000 mg | ORAL_TABLET | ORAL | Status: DC | PRN

## 2021-06-14 MED ORDER — MIDAZOLAM HCL 2 MG/2ML IJ SOLN
INTRAMUSCULAR | Status: DC | PRN
  Administered 2021-06-14: 1 mg via INTRAVENOUS

## 2021-06-14 MED ORDER — ACETAMINOPHEN 500 MG PO TABS
1000.0000 mg | ORAL_TABLET | ORAL | Status: AC
  Administered 2021-06-14: 1000 mg via ORAL

## 2021-06-14 MED ORDER — PROMETHAZINE HCL 25 MG/ML IJ SOLN: 6.2500 mg | INTRAMUSCULAR | Status: DC | PRN

## 2021-06-14 MED ORDER — LIDOCAINE HCL (CARDIAC) PF 100 MG/5ML IV SOSY
PREFILLED_SYRINGE | INTRAVENOUS | Status: DC | PRN
Start: 2021-06-14 — End: 2021-06-14
  Administered 2021-06-14: 40 mg via INTRAVENOUS

## 2021-06-14 MED ORDER — MIDAZOLAM HCL 2 MG/2ML IJ SOLN
INTRAMUSCULAR | Status: AC
  Filled 2021-06-14: qty 2

## 2021-06-14 MED ORDER — PROPOFOL 10 MG/ML IV BOLUS
INTRAVENOUS | Status: DC | PRN
  Administered 2021-06-14 (×2): 40 mg via INTRAVENOUS
  Administered 2021-06-14 (×2): 20 mg via INTRAVENOUS
  Administered 2021-06-14: 150 mg via INTRAVENOUS
  Administered 2021-06-14: 40 mg via INTRAVENOUS

## 2021-06-14 MED ORDER — SILVER NITRATE-POT NITRATE 75-25 % EX MISC
CUTANEOUS | Status: DC | PRN
  Administered 2021-06-14: 1

## 2021-06-14 MED ORDER — ACETAMINOPHEN 160 MG/5ML PO SOLN: 325.0000 mg | ORAL | Status: DC | PRN

## 2021-06-14 SURGICAL SUPPLY — 27 items
ADH SKN CLS APL DERMABOND .7 (GAUZE/BANDAGES/DRESSINGS) ×1
BAG SPEC RTRVL LRG 6X4 10 (ENDOMECHANICALS)
DERMABOND ADVANCED (GAUZE/BANDAGES/DRESSINGS) ×1
DERMABOND ADVANCED .7 DNX12 (GAUZE/BANDAGES/DRESSINGS) ×1 IMPLANT
GAUZE 4X4 16PLY ~~LOC~~+RFID DBL (SPONGE) ×2 IMPLANT
GLOVE SURG ENC MOIS LTX SZ6 (GLOVE) ×2 IMPLANT
GLOVE SURG POLYISO LF SZ6.5 (GLOVE) ×2 IMPLANT
GLOVE SURG UNDER POLY LF SZ6.5 (GLOVE) ×6 IMPLANT
GOWN STRL REUS W/ TWL LRG LVL3 (GOWN DISPOSABLE) ×1 IMPLANT
GOWN STRL REUS W/TWL LRG LVL3 (GOWN DISPOSABLE) ×6 IMPLANT
KIT TURNOVER CYSTO (KITS) ×2 IMPLANT
LIGASURE VESSEL 5MM BLUNT TIP (ELECTROSURGICAL) ×2 IMPLANT
NS IRRIG 1000ML POUR BTL (IV SOLUTION) ×2 IMPLANT
PACK LAPAROSCOPY BASIN (CUSTOM PROCEDURE TRAY) ×2 IMPLANT
PACK TRENDGUARD 450 HYBRID PRO (MISCELLANEOUS) ×1 IMPLANT
POUCH SPECIMEN RETRIEVAL 10MM (ENDOMECHANICALS) IMPLANT
PROTECTOR NERVE ULNAR (MISCELLANEOUS) ×4 IMPLANT
SET IRRIG TUBING LAPAROSCOPIC (IRRIGATION / IRRIGATOR) IMPLANT
SET TUBE SMOKE EVAC HIGH FLOW (TUBING) ×2 IMPLANT
SUT MNCRL AB 3-0 PS2 27 (SUTURE) ×2 IMPLANT
SUT VICRYL 0 UR6 27IN ABS (SUTURE) IMPLANT
TOWEL OR 17X26 10 PK STRL BLUE (TOWEL DISPOSABLE) ×4 IMPLANT
TRAY FOLEY W/BAG SLVR 14FR (SET/KITS/TRAYS/PACK) ×2 IMPLANT
TRENDGUARD 450 HYBRID PRO PACK (MISCELLANEOUS) ×2
TROCAR XCEL NON-BLD 11X100MML (ENDOMECHANICALS) ×2 IMPLANT
TROCAR XCEL NON-BLD 5MMX100MML (ENDOMECHANICALS) ×4 IMPLANT
WARMER LAPAROSCOPE (MISCELLANEOUS) ×2 IMPLANT

## 2021-06-14 NOTE — Anesthesia Preprocedure Evaluation (Addendum)
Anesthesia Evaluation  Patient identified by MRN, date of birth, ID band Patient awake    Reviewed: Allergy & Precautions, NPO status , Patient's Chart, lab work & pertinent test results  History of Anesthesia Complications (+) PONV and history of anesthetic complications  Airway Mallampati: I  TM Distance: >3 FB Neck ROM: Full    Dental  (+) Teeth Intact, Dental Advisory Given   Pulmonary    breath sounds clear to auscultation       Cardiovascular hypertension,  Rhythm:Regular Rate:Normal     Neuro/Psych  Headaches, Anxiety    GI/Hepatic negative GI ROS, Neg liver ROS,   Endo/Other  negative endocrine ROS  Renal/GU negative Renal ROS     Musculoskeletal negative musculoskeletal ROS (+)   Abdominal Normal abdominal exam  (+)   Peds  Hematology negative hematology ROS (+)   Anesthesia Other Findings   Reproductive/Obstetrics                            Anesthesia Physical Anesthesia Plan  ASA: 2  Anesthesia Plan: General   Post-op Pain Management:    Induction: Intravenous  PONV Risk Score and Plan: 4 or greater and Ondansetron, Dexamethasone, Midazolam and Scopolamine patch - Pre-op  Airway Management Planned: Oral ETT  Additional Equipment: None  Intra-op Plan:   Post-operative Plan: Extubation in OR  Informed Consent: I have reviewed the patients History and Physical, chart, labs and discussed the procedure including the risks, benefits and alternatives for the proposed anesthesia with the patient or authorized representative who has indicated his/her understanding and acceptance.     Dental advisory given  Plan Discussed with: CRNA  Anesthesia Plan Comments:        Anesthesia Quick Evaluation

## 2021-06-14 NOTE — Op Note (Signed)
06/14/2021   1:26 PM   PATIENT:  Amanda Mooney  40 y.o. female   PRE-OPERATIVE DIAGNOSIS:  desire for permanent sterilization   POST-OPERATIVE DIAGNOSIS:  desire for permanent sterilization   PROCEDURE:  Procedure(s): LAPAROSCOPIC BILATERAL SALPINGECTOMY (Bilateral)   SURGEON:  Surgeon(s) and Role:    * Keshara Kiger, Edwinna Areola, MD - Primary    * Jerelyn Charles, MD - Assisting   ANESTHESIA:   local and general   EBL:  10 mL    BLOOD ADMINISTERED:none   DRAINS: Urinary Catheter (Foley)    LOCAL MEDICATIONS USED:  MARCAINE      SPECIMEN:  Source of Specimen:  bilateral fallopian tubes   DISPOSITION OF SPECIMEN:  PATHOLOGY   COUNTS:  YES   PLAN OF CARE: Discharge to home after PACU   PATIENT DISPOSITION:  PACU - hemodynamically stable.  FINDINGS: Normal appearing uterus, bilateral fallopian tubes, bilateral ovaries. Engorged left ovarian vessels that drained quickly when ovary was elevated. Normal appearing appendix and upper abdomen on gross inspection.    PROCEDURE IN DETAIL:   The patient was was appropriately consented and taken to the operative room where thromboguards were applied to the lower extremities and general anesthesia was obtained without difficulty. She was placed in the dorsal lithotomy position with her legs in stirrups. The patient was prepped and draped in normal sterile fashion. A foley catheter was inserted and the bladder was emptied. A speculum was placed into the vagina. The anterior lip of the cervix was grasped with a tenaculum.  An acorn uterine manipulator was introduced and the speculum was removed.    Attention was turned to the abdominal field. A 24mm vertical skin incision was made at the base of the umbilicus. A veress needle was inserted through the incision into the abdominal cavity. Correct placement was confirmed by opening pressure of 92mmHg. Pneumoperitoneum was established with the CO2 gas to a pressure of 101mmHg.The veress needle was removed  and a 72mm trocar was inserted into the abdomen cavity under direct laparoscopic visualization.  An intraabdominal survey revealed no visceral or vascular injury from entry. In the right lower quadrant, a 8mm trocar was inserted into the abdomen under direct laparoscopic visualization. In the left lower quadrant, a 89mm trocar was inserted into the abdomen under direct laparoscopic visualization.   Findings as noted above.   Attention was turned to the left fallopian tube. Using the Ligasure device, the mesosalpinx was serially ligated until the left  fallopian tube was completely freed. The fallopian tube was removed through the 92mm port. The same was repeated on the right side. Pneumoperitoneum pressure was reduced to 35mmHg and hemostasis was appreciated.    The pneumoperitoneum was released and all instruments were removed from the abdomen. The skin incisions were closed with 3-0 monocryl suture in a subcuticular fashion. The sites were infiltrated with 0.25% marcaine. The incisions were covered with dermabond. All instruments were removed from the vagina.  Hemostasis was noted at the cervical tenaculum puncture sites after application of silver nitrate. The foley catheter was removed. The patient was awakened from general anesthesia and taken to the recovery room in stable condition.  Irene Pap, MD 06/14/21 1:29 PM

## 2021-06-14 NOTE — Anesthesia Procedure Notes (Signed)
Procedure Name: Intubation Date/Time: 06/14/2021 12:32 PM Performed by: Georgeanne Nim, CRNA Pre-anesthesia Checklist: Patient identified, Emergency Drugs available, Suction available, Patient being monitored and Timeout performed Patient Re-evaluated:Patient Re-evaluated prior to induction Oxygen Delivery Method: Circle system utilized Preoxygenation: Pre-oxygenation with 100% oxygen Induction Type: IV induction Ventilation: Mask ventilation without difficulty Laryngoscope Size: Mac and 4 Grade View: Grade I Tube type: Oral Number of attempts: 1 Airway Equipment and Method: Stylet Placement Confirmation: breath sounds checked- equal and bilateral, CO2 detector, positive ETCO2 and ETT inserted through vocal cords under direct vision Tube secured with: Tape Dental Injury: Teeth and Oropharynx as per pre-operative assessment

## 2021-06-14 NOTE — H&P (Signed)
Amanda Mooney is an 40 y.o. female P3 with desire for permanent sterilization. Currently taking POPs and breastfeeding.    Pertinent Gynecological History: Menses:  amenorrhea (breastfeeding) Bleeding: none Contraception: oral progesterone-only contraceptive DES exposure: unknown Blood transfusions: none Sexually transmitted diseases: no past history Previous GYN Procedures: D&C Last mammogram:  none  Date: Last pap: normal Date: 09/21/20 OB History: G7, G1829   Menstrual History:  Patient's last menstrual period was 06/25/2020 (exact date).    Past Medical History:  Diagnosis Date   Anemia    Complication of anesthesia    " very hard to wake"   Gestational thrombocytopenia ([place])    H/O syncope 09/2020   followed by cardiology-- dr Raliegh Ip. [place];    during pregnancy ;  echo and  event monitor in epic 03/ 2022 ;  (06-11-2021 pt stated no syncopal or near syncope since 02/ 2022)   History of chlamydia    Hx of abnormal cervical Pap smear    Hypertension    Migraine    PONV (postoperative nausea and vomiting)    PSVT (paroxysmal supraventricular tachycardia) (HCC)    takes propranolol prn for palpitations   Wears glasses     Past Surgical History:  Procedure Laterality Date   CERVICAL DISC ARTHROPLASTY N/A 02/01/2019   Procedure: CERVICAL ANTERIOR DISC ARTHROPLASTY  C6-7;  Surgeon: Meade Maw, MD;  Location: [facility] ORS;  Service: Neurosurgery;  Laterality: N/A;   DILATION AND CURETTAGE OF UTERUS  2007   HERNIA REPAIR     infant  (type unknown)    Family History  Problem Relation Age of Onset   Diabetes Mother    Hypertension Mother    Hypertension Father    Healthy Maternal Grandmother    Cancer - Prostate Maternal Grandfather    Heart Problems Maternal Grandfather    Heart attack Paternal Grandmother    Cirrhosis Paternal Grandfather    Healthy Brother    Healthy Daughter    Healthy Daughter    Healthy Son     Social History:  reports that she has never  smoked. She has never used smokeless tobacco. She reports that she does not drink alcohol and does not use drugs.  Allergies: No Known Allergies  Medications Prior to Admission  Medication Sig Dispense Refill Last Dose   butalbital-acetaminophen-caffeine (FIORICET) 50-325-40 MG tablet Take 1 tablet by mouth 2 (two) times daily as needed for headache.   06/14/2021   labetalol (NORMODYNE) 200 MG tablet Take 200 mg by mouth daily.   06/13/2021   Prenatal Vit-DSS-Fe Cbn-FA (PRENATAL AD PO) Take 1 tablet by mouth daily at 6 (six) AM.   06/13/2021   propranolol (INDERAL) 20 MG tablet Take 20 mg by mouth 3 (three) times daily as needed.   More than a month   valsartan (DIOVAN) 160 MG tablet Take 1 tablet (160 mg total) by mouth daily. 90 tablet 3 Unknown    Review of Systems  Constitutional:  Negative for fever.  HENT:  Negative for sore throat.   Eyes:  Negative for pain.  Respiratory:  Negative for shortness of breath.   Cardiovascular:  Negative for chest pain.  Gastrointestinal:  Negative for abdominal pain.  Genitourinary:  Negative for dysuria.  Musculoskeletal:  Negative for myalgias.  Skin:  Negative for rash.  Neurological:  Negative for headaches.  Psychiatric/Behavioral:  Negative for dysphoric mood.    Blood pressure 136/87, pulse 86, temperature 98 F (36.7 C), temperature source Oral, resp. rate 15, height 5'  1" (1.549 m), weight 69 kg, last menstrual period 06/25/2020, SpO2 100 %, currently breastfeeding. Physical Exam From 9/13 office exam  Constitutional General Appearance: healthy-appearing, well-nourished, well-developed  Cardiovascular Auscultation: RRR, no murmur Peripheral Vascular: no varicosities, no edema  Lungs Respiratory Effort: no accessory muscle usage, no intercostal retractions Auscultation: clear to auscultation, no wheezing, no rales/crackles, no rhonchi Inspection: normal, normal respiratory rate  Breast Bilateral: no skin changes, nipple  appearance: normal, no abnormal nipple secretions, no tenderness, no masses palpable Right Breast: normal Left Breast: normal  Abdomen Inspection/Palpation/Auscultation: soft, non-distended, no tenderness  Female Genitalia Mons: normal, no erythema, no excoriation, no atrophy, no lesions, no vesicles/ ulcers, no masses, no swelling, no tenderness Labia Majora: normal, no erythema, no excoriation, no atrophy, no discoloration, no lesions, no vesicles/ ulcers, no masses, no swelling, no tenderness Labia Minora: normal, no erythema, no excoriation, no atrophy, no discoloration, no lesions, no vesicles, no masses, no swelling, no tenderness Introitus: normal Vagina: normal, no discharge, no erythema, no atrophy, no lesions, no ulcers, no swelling, no masses, no tenderness, no prolapse (scant dark blood in vault) Cervix: grossly normal, no lesions, no discharge, no bleeding, no cervical motion tenderness Uterus: normal size, normal contour, midline, no uterine prolapse, mobile, non-tender Urethral Meatus/ Urethra normal meatus Adnexa/Parametria: no mass palpable, no tenderness  Rectum Anus & Perineum: normal perineum  Extremities Legs: normal Arms: normal Pulses: normal  Neurological System Impressions motor: no deficits, sensory: no deficits  Psychiatric Orientation: to person, to place, to time Mood and Affect: active and alert, normal mood, normal affect  Results for orders placed or performed during the hospital encounter of 06/14/21 (from the past 24 hour(s))  Pregnancy, urine POC     Status: None   Collection Time: 06/14/21 10:29 AM  Result Value Ref Range   Preg Test, Ur NEGATIVE NEGATIVE  I-STAT, chem 8     Status: Abnormal   Collection Time: 06/14/21 11:14 AM  Result Value Ref Range   Sodium 143 135 - 145 mmol/L   Potassium 3.3 (L) 3.5 - 5.1 mmol/L   Chloride 104 98 - 111 mmol/L   BUN 9 6 - 20 mg/dL   Creatinine, Ser 0.60 0.44 - 1.00 mg/dL   Glucose, Bld 77 70 - 99  mg/dL   Calcium, Ion 1.20 1.15 - 1.40 mmol/L   TCO2 25 22 - 32 mmol/L   Hemoglobin 13.3 12.0 - 15.0 g/dL   HCT 39.0 36.0 - 46.0 %    No results found.  Assessment/Plan: 40Y P3 with desire for permanent sterilization, here for laparoscopic bilateral salpingectomy.  - Risks/benefits/alternatives reviewed, including infection, bleeding, damage to surrounding organs, need for laparotomy, sterilization failure. Informed consent obtained.   Rowland Lathe 06/14/2021, 11:48 AM

## 2021-06-14 NOTE — Transfer of Care (Signed)
Immediate Anesthesia Transfer of Care Note  Patient: Amanda Mooney  Procedure(s) Performed: LAPAROSCOPIC BILATERAL SALPINGECTOMY (Bilateral)  Patient Location: PACU  Anesthesia Type:General  Level of Consciousness: drowsy and patient cooperative  Airway & Oxygen Therapy: Patient Spontanous Breathing and Patient connected to nasal cannula oxygen  Post-op Assessment: Report given to RN and Post -op Vital signs reviewed and stable  Post vital signs: Reviewed and stable  Last Vitals:  Vitals Value Taken Time  BP 150/93 06/14/21 1331  Temp 36.4 C 06/14/21 1330  Pulse 93 06/14/21 1336  Resp 15 06/14/21 1336  SpO2 100 % 06/14/21 1336  Vitals shown include unvalidated device data.  Last Pain:  Vitals:   06/14/21 1047  TempSrc: Oral  PainSc: 0-No pain      Patients Stated Pain Goal: 4 (58/52/77 8242)  Complications: No notable events documented.

## 2021-06-14 NOTE — Discharge Instructions (Signed)
DISCHARGE INSTRUCTIONS: Laparoscopy ° °The following instructions have been prepared to help you care for yourself upon your return home today. ° °Wound care: °• Do not get the incision wet for the first 24 hours. The incision should be kept clean and dry. °• The Band-Aids or dressings may be removed the day after surgery. °• Should the incision become sore, red, and swollen after the first week, check with your doctor. ° °Personal hygiene: °• Shower the day after your procedure. ° °Activity and limitations: °• Do NOT drive or operate any equipment today. °• Do NOT lift anything more than 15 pounds for 2-3 weeks after surgery. °• Do NOT rest in bed all day. °• Walking is encouraged. Walk each day, starting slowly with 5-minute walks 3 or 4 times a day. Slowly increase the length of your walks. °• Walk up and down stairs slowly. °• Do NOT do strenuous activities, such as golfing, playing tennis, bowling, running, biking, weight lifting, gardening, mowing, or vacuuming for 2-4 weeks. Ask your doctor when it is okay to start. ° °Diet: Eat a light meal as desired this evening. You may resume your usual diet tomorrow. ° °Return to work: This is dependent on the type of work you do. For the most part you can return to a desk job within a week of surgery. If you are more active at work, please discuss this with your doctor. ° °What to expect after your surgery: You may have a slight burning sensation when you urinate on the first day. You may have a very small amount of blood in the urine. Expect to have a small amount of vaginal discharge/light bleeding for 1-2 weeks. It is not unusual to have abdominal soreness and bruising for up to 2 weeks. You may be tired and need more rest for about 1 week. You may experience shoulder pain for 24-72 hours. Lying flat in bed may relieve it. ° °Call your doctor for any of the following: °• Develop a fever of 100.4 or greater °• Inability to urinate 6 hours after discharge from  hospital °• Severe pain not relieved by pain medications °• Persistent of heavy bleeding at incision site °• Redness or swelling around incision site after a week °• Increasing nausea or vomiting ° ° °Post Anesthesia Home Care Instructions ° °Activity: °Get plenty of rest for the remainder of the day. A responsible individual must stay with you for 24 hours following the procedure.  °For the next 24 hours, DO NOT: °-Drive a car °-Operate machinery °-Drink alcoholic beverages °-Take any medication unless instructed by your physician °-Make any legal decisions or sign important papers. ° °Meals: °Start with liquid foods such as gelatin or soup. Progress to regular foods as tolerated. Avoid greasy, spicy, heavy foods. If nausea and/or vomiting occur, drink only clear liquids until the nausea and/or vomiting subsides. Call your physician if vomiting continues. ° °Special Instructions/Symptoms: °Your throat may feel dry or sore from the anesthesia or the breathing tube placed in your throat during surgery. If this causes discomfort, gargle with warm salt water. The discomfort should disappear within 24 hours. ° °If you had a scopolamine patch placed behind your ear for the management of post- operative nausea and/or vomiting: ° °1. The medication in the patch is effective for 72 hours, after which it should be removed.  Wrap patch in a tissue and discard in the trash. Wash hands thoroughly with soap and water. °2. You may remove the patch earlier than 72   hours if you experience unpleasant side effects which may include dry mouth, dizziness or visual disturbances. 3. Avoid touching the patch. Wash your hands with soap and water after contact with the patch.  No acetaminophen/Tylenol until after 5:00 pm today if needed.

## 2021-06-14 NOTE — Brief Op Note (Signed)
06/14/2021  1:26 PM  PATIENT:  Amanda Mooney  40 y.o. female  PRE-OPERATIVE DIAGNOSIS:  desire for permanent sterilization  POST-OPERATIVE DIAGNOSIS:  desire for permanent sterilization  PROCEDURE:  Procedure(s): LAPAROSCOPIC BILATERAL SALPINGECTOMY (Bilateral)  SURGEON:  Surgeon(s) and Role:    * Waris Rodger, Edwinna Areola, MD - Primary    * Jerelyn Charles, MD - Assisting  ANESTHESIA:   local and general  EBL:  10 mL   BLOOD ADMINISTERED:none  DRAINS: Urinary Catheter (Foley)   LOCAL MEDICATIONS USED:  MARCAINE     SPECIMEN:  Source of Specimen:  bilateral fallopian tubes  DISPOSITION OF SPECIMEN:  PATHOLOGY  COUNTS:  YES  TOURNIQUET:  * No tourniquets in log *  DICTATION: .Note written in EPIC  PLAN OF CARE: Discharge to home after PACU  PATIENT DISPOSITION:  PACU - hemodynamically stable.   Delay start of Pharmacological VTE agent (>24hrs) due to surgical blood loss or risk of bleeding: not applicable

## 2021-06-17 ENCOUNTER — Encounter (HOSPITAL_BASED_OUTPATIENT_CLINIC_OR_DEPARTMENT_OTHER): Payer: Self-pay | Admitting: Obstetrics and Gynecology

## 2021-06-17 LAB — SURGICAL PATHOLOGY

## 2021-06-21 ENCOUNTER — Other Ambulatory Visit: Payer: Self-pay

## 2021-06-21 ENCOUNTER — Encounter: Payer: Self-pay | Admitting: Cardiology

## 2021-06-21 ENCOUNTER — Ambulatory Visit (INDEPENDENT_AMBULATORY_CARE_PROVIDER_SITE_OTHER): Payer: BC Managed Care – PPO | Admitting: Cardiology

## 2021-06-21 VITALS — BP 140/90 | HR 98 | Ht 61.0 in | Wt 154.5 lb

## 2021-06-21 DIAGNOSIS — I471 Supraventricular tachycardia: Secondary | ICD-10-CM | POA: Diagnosis not present

## 2021-06-21 DIAGNOSIS — O165 Unspecified maternal hypertension, complicating the puerperium: Secondary | ICD-10-CM | POA: Diagnosis not present

## 2021-06-21 MED ORDER — VALSARTAN 320 MG PO TABS: 320.0000 mg | ORAL_TABLET | Freq: Every day | ORAL | 3 refills | Status: DC

## 2021-06-21 NOTE — Progress Notes (Signed)
Cardio-Obstetrics Clinic  Follow Up Note   Date:  06/21/2021   ID:  Amanda, Mooney 1980-09-07, MRN [id number]  PCP:  London Pepper, MD   [facility] Providers Cardiologist:  Berniece Salines, DO  Electrophysiologist:  None        Referring MD: London Pepper, MD   Chief Complaint: Blood pressure still elevated  History of Present Illness:    Amanda Mooney is a 40 y.o. female [[id number]] who returns for follow up of postpartum hypertension  She has a medical history of of paroxysmal SVT which was diagnosed about 10 to 15 years ago, at that time the patient tells me that she was considered for ablation but first she was giving some metoprolol which helped in ablation was not pursued.     Of note did tell me at her initial visit that her last 2 pregnancies she has had multiple syncope episodes with the most recent pregnancy in her 33-year-old when she had 2 syncope episodes most of which she feels lightheaded, some palpitations dizziness and most times if she does not lie flat she would pass out.  Recently she has been able to control the syncope episodes by just lying flat when she experiences the symptoms.   During her visit given her palpitations and history of SVT I placed a monitor on the patient as well as get an echocardiogram.   I last saw the patient on November 14, 2020 at that time we continued her propanolol as needed.  Since I saw the patient she tells me that she has not had any syncope episodes and her propanolol has been aborting her SVT.  No other complaints at this time.   I saw the patient on March 25, 2021 at that time she was doing well from a cardiovascular standpoint.  Since I saw the patient she has delivered and has been experiencing postpartum hypertension.  She is currently was on labetalol 200 mg daily.  And losartan 200 mg daily.   When I saw the patient via teleneurology on June 11, 2021 I stop losartan and place the patient on valsartan at 160 mg daily.   Today she tells me that her blood pressure is still elevated.  She offers no other complaints at this time.  Prior CV Studies Reviewed: The following studies were reviewed today:   Past Medical History:  Diagnosis Date   Anemia    Complication of anesthesia    " very hard to wake"   Gestational thrombocytopenia ([place])    H/O syncope 09/2020   followed by cardiology-- dr Raliegh Ip. [place];    during pregnancy ;  echo and  event monitor in epic 03/ 2022 ;  (06-11-2021 pt stated no syncopal or near syncope since 02/ 2022)   History of chlamydia    Hx of abnormal cervical Pap smear    Hypertension    Migraine    PONV (postoperative nausea and vomiting)    PSVT (paroxysmal supraventricular tachycardia) (HCC)    takes propranolol prn for palpitations   Wears glasses     Past Surgical History:  Procedure Laterality Date   CERVICAL DISC ARTHROPLASTY N/A 02/01/2019   Procedure: CERVICAL ANTERIOR DISC ARTHROPLASTY  C6-7;  Surgeon: Meade Maw, MD;  Location: [facility] ORS;  Service: Neurosurgery;  Laterality: N/A;   DILATION AND CURETTAGE OF UTERUS  2007   HERNIA REPAIR     infant  (type unknown)   LAPAROSCOPIC BILATERAL SALPINGECTOMY Bilateral 06/14/2021   Procedure: LAPAROSCOPIC BILATERAL SALPINGECTOMY;  Surgeon: Rowland Lathe, MD;  Location: [facility];  Service: Gynecology;  Laterality: Bilateral;      OB History     Gravida  7   Para  4   Term  2   Preterm  2   AB  3   Living  4      SAB  1   IAB  2   Ectopic      Multiple  0   Live Births  2               Current Medications: Current Meds  Medication Sig   butalbital-acetaminophen-caffeine (FIORICET) 50-325-40 MG tablet Take 1 tablet by mouth 2 (two) times daily as needed for headache.   ibuprofen (ADVIL) 200 MG tablet Take 3 tablets (600 mg total) by mouth every 6 (six) hours as needed.   oxyCODONE (OXY IR/ROXICODONE) 5 MG immediate release tablet Take 1 tablet (5 mg total) by mouth  every 6 (six) hours as needed for severe pain.   Prenatal Vit-DSS-Fe Cbn-FA (PRENATAL AD PO) Take 1 tablet by mouth daily at 6 (six) AM.   propranolol (INDERAL) 20 MG tablet Take 20 mg by mouth 3 (three) times daily as needed.   valsartan (DIOVAN) 160 MG tablet Take 1 tablet (160 mg total) by mouth daily.     Allergies:   Patient has no known allergies.   Social History   Socioeconomic History   Marital status: Married    Spouse name: Not on file   Number of children: Not on file   Years of education: Not on file   Highest education level: Not on file  Occupational History   Not on file  Tobacco Use   Smoking status: Never   Smokeless tobacco: Never  Vaping Use   Vaping Use: Never used  Substance and Sexual Activity   Alcohol use: No   Drug use: No   Sexual activity: Yes    Birth control/protection: None  Other Topics Concern   Not on file  Social History Narrative   ** Merged History Encounter **       Social Determinants of Health   Financial Resource Strain: Not on file  Food Insecurity: Not on file  Transportation Needs: Not on file  Physical Activity: Not on file  Stress: Not on file  Social Connections: Not on file      Family History  Problem Relation Age of Onset   Diabetes Mother    Hypertension Mother    Hypertension Father    Healthy Maternal Grandmother    Cancer - Prostate Maternal Grandfather    Heart Problems Maternal Grandfather    Heart attack Paternal Grandmother    Cirrhosis Paternal Biochemist, clinical    Healthy Daughter    Healthy Daughter    Healthy Son       ROS:   Please see the history of present illness.     All other systems reviewed and are negative.   Labs/EKG Reviewed:    EKG:   EKG is was not ordered today.    Recent Labs: 11/05/2020: ALT 19 04/06/2021: Platelets 55 06/14/2021: BUN 9; Creatinine, Ser 0.60; Hemoglobin 13.3; Potassium 3.3; Sodium 143   Recent Lipid Panel No results found for: CHOL,  TRIG, HDL, CHOLHDL, LDLCALC, LDLDIRECT  Physical Exam:    VS:  LMP 06/25/2020     Wt Readings from Last 3 Encounters:  06/14/21 152 lb 1.6 oz (69 kg)  04/04/21 182 lb 11.2 oz (82.9 kg)  03/25/21 179 lb 0.6 oz (81.2 kg)     GEN:  Well nourished, well developed in no acute distress HEENT: Normal NECK: No JVD; No carotid bruits LYMPHATICS: No lymphadenopathy CARDIAC: RRR, no murmurs, rubs, gallops RESPIRATORY:  Clear to auscultation without rales, wheezing or rhonchi  ABDOMEN: Soft, non-tender, non-distended MUSCULOSKELETAL:  No edema; No deformity  SKIN: Warm and dry NEUROLOGIC:  Alert and oriented x 3 PSYCHIATRIC:  Normal affect    Risk Assessment/Risk Calculators:                 ASSESSMENT & PLAN:    Postpartum hypertension Paroxysmal SVT  She still not at target of less than 130/80 mmHg.  I would like to increase her valsartan to 360 mg a day.  She will continue on the labetalol.  If she is no improvement by her next visit I plan to stop labetalol and transition the patient to carvedilol.  We talked a little bit about postpartum hypertension and the risk for it.  Her goal right now is to make sure that the patient does not have an exacerbation that will lead to an inpatient admission.  At the same time overall her risk for developing chronic hypertension is high.  I explained this to the patient.  Her hopes is to be able to get off antihypertensive medication which I do not think is unreasonable.    She will continue to monitor her blood pressure at home.  There are no Patient Instructions on file for this visit.   Dispo:  No follow-ups on file.   Medication Adjustments/Labs and Tests Ordered: Current medicines are reviewed at length with the patient today.  Concerns regarding medicines are outlined above.  Tests Ordered: No orders of the defined types were placed in this encounter.  Medication Changes: No orders of the defined types were placed in this  encounter.

## 2021-06-21 NOTE — Patient Instructions (Addendum)
Medication Instructions:  Your physician has recommended you make the following change in your medication:  INCREASE: Valsartan 320 mg once daily Please take your blood pressure daily for 2 weeks and send in a MyChart message.  *If you need a refill on your cardiac medications before your next appointment, please call your pharmacy*   Lab Work: None If you have labs (blood work) drawn today and your tests are completely normal, you will receive your results only by: [place] (if you have MyChart) OR A paper copy in the mail If you have any lab test that is abnormal or we need to change your treatment, we will call you to review the results.   Testing/Procedures: None   Follow-Up: At [facility], you and your health needs are our priority.  As part of our continuing mission to provide you with exceptional heart care, we have created designated Provider Care Teams.  These Care Teams include your primary Cardiologist (physician) and Advanced Practice Providers (APPs -  Physician Assistants and Nurse Practitioners) who all work together to provide you with the care you need, when you need it.  We recommend signing up for the patient portal called "MyChart".  Sign up information is provided on this After Visit Summary.  MyChart is used to connect with patients for Virtual Visits (Telemedicine).  Patients are able to view lab/test results, encounter notes, upcoming appointments, etc.  Non-urgent messages can be sent to your provider as well.   To learn more about what you can do with MyChart, go to [email or web address].    Your next appointment:   3 month(s)  The format for your next appointment:   In Person  Provider:   Berniece Salines, DO [address], [place], [place] [address]    Other Instructions

## 2021-06-21 NOTE — Anesthesia Postprocedure Evaluation (Signed)
Anesthesia Post Note  Patient: Amanda Mooney  Procedure(s) Performed: LAPAROSCOPIC BILATERAL SALPINGECTOMY (Bilateral)     Patient location during evaluation: PACU Anesthesia Type: General Level of consciousness: awake and alert Pain management: pain level controlled Vital Signs Assessment: post-procedure vital signs reviewed and stable Respiratory status: spontaneous breathing, nonlabored ventilation, respiratory function stable and patient connected to nasal cannula oxygen Cardiovascular status: blood pressure returned to baseline and stable Postop Assessment: no apparent nausea or vomiting Anesthetic complications: no   No notable events documented.      Effie Berkshire

## 2021-07-10 ENCOUNTER — Telehealth: Payer: Self-pay

## 2021-07-10 ENCOUNTER — Other Ambulatory Visit: Payer: Self-pay

## 2021-07-10 MED ORDER — VALSARTAN 320 MG PO TABS: 320.0000 mg | ORAL_TABLET | Freq: Every day | ORAL | 3 refills | Status: AC

## 2021-07-10 MED ORDER — CARVEDILOL 3.125 MG PO TABS: 3.1250 mg | ORAL_TABLET | Freq: Two times a day (BID) | ORAL | 3 refills | Status: AC

## 2021-07-10 NOTE — Telephone Encounter (Signed)
Called pt to inquire about what medication she is taking. No answer, voicemail is full. Unable to leave a voicemail.

## 2021-07-10 NOTE — Telephone Encounter (Signed)
Prescription and refill sent to pharmacy.

## 2021-09-06 DIAGNOSIS — D693 Immune thrombocytopenic purpura: Secondary | ICD-10-CM | POA: Diagnosis not present

## 2021-09-06 DIAGNOSIS — D649 Anemia, unspecified: Secondary | ICD-10-CM | POA: Diagnosis not present

## 2021-09-09 ENCOUNTER — Telehealth: Payer: Self-pay | Admitting: Oncology

## 2021-09-09 NOTE — Telephone Encounter (Signed)
Attempted to contact patient in regards of referral. Per RN Abigail Butts : "Due to our schedule out until med Feb with new patients and her plt count is 45, she needs to be seen earlier and she lives in [place], she should be scheduled at [facility] or [facility] please ". Voicemail was not set up so was not able to leave message.

## 2021-09-10 ENCOUNTER — Telehealth: Payer: Self-pay | Admitting: Cardiology

## 2021-09-10 NOTE — Telephone Encounter (Signed)
Spoke to patient . Per Dr Harriet Masson, okay to switch to virtual - if patient is able to vital signs for appointment.  Patient states she has the the ability to have vital signs available. RN  instructed patient on the video process. Patient states last time she had an issue with mychart  and would like  text link to her phone instead of using mychart route. Rn informed patient will indicate on her  appointment note.

## 2021-09-10 NOTE — Telephone Encounter (Signed)
Patient is requesting to convert 1/20 appointment with Dr. Harriet Masson to a virtual. States she will be unable to leave work, but is able to step away for a virtual appointment. Is it alright to convert to virtual or will patient need to reschedule for another day in office?  Please advise when able.

## 2021-09-11 ENCOUNTER — Telehealth: Payer: Self-pay | Admitting: Oncology

## 2021-09-11 NOTE — Telephone Encounter (Signed)
Scheduled appt per 1/16 referral. Spoke to pt who is aware of appt date and time. Pt is aware to arrive 15 mins prior to appt time.  °

## 2021-09-13 ENCOUNTER — Other Ambulatory Visit: Payer: Self-pay

## 2021-09-13 ENCOUNTER — Telehealth (INDEPENDENT_AMBULATORY_CARE_PROVIDER_SITE_OTHER): Payer: BC Managed Care – PPO | Admitting: Cardiology

## 2021-09-13 ENCOUNTER — Telehealth: Payer: Self-pay

## 2021-09-13 ENCOUNTER — Encounter: Payer: Self-pay | Admitting: Cardiology

## 2021-09-13 VITALS — BP 127/81 | HR 95 | Ht 61.0 in | Wt 145.0 lb

## 2021-09-13 DIAGNOSIS — O165 Unspecified maternal hypertension, complicating the puerperium: Secondary | ICD-10-CM

## 2021-09-13 DIAGNOSIS — I471 Supraventricular tachycardia: Secondary | ICD-10-CM

## 2021-09-13 NOTE — Patient Instructions (Addendum)
Medication Instructions:  Your physician recommends that you continue on your current medications as directed. Please refer to the Current Medication list given to you today.  *If you need a refill on your cardiac medications before your next appointment, please call your pharmacy*  Lab Work: NONE ordered at this time of appointment   If you have labs (blood work) drawn today and your tests are completely normal, you will receive your results only by: [place] (if you have MyChart) OR A paper copy in the mail If you have any lab test that is abnormal or we need to change your treatment, we will call you to review the results.  Testing/Procedures: NONE ordered at this time of appointment   Follow-Up: At [facility], you and your health needs are our priority.  As part of our continuing mission to provide you with exceptional heart care, we have created designated Provider Care Teams.  These Care Teams include your primary Cardiologist (physician) and Advanced Practice Providers (APPs -  Physician Assistants and Nurse Practitioners) who all work together to provide you with the care you need, when you need it.  Your next appointment:   6 month(s)  The format for your next appointment:   In Person  Provider:   Berniece Salines, DO    Other Instructions

## 2021-09-13 NOTE — Progress Notes (Signed)
[facility]  Follow Up Note   Date:  09/13/2021   ID:  Amanda Mooney, Amanda Mooney [birth year], MRN [id number]  PCP:  London Pepper, MD   [facility] Providers Cardiologist:  Berniece Salines, DO  Electrophysiologist:  None        Referring MD: London Pepper, MD   Chief Complaint: " I am doing well"  Virtual Visit via Video  Note . I connected with the patient today by a   video enabled telemedicine application and verified that I am speaking with the correct person using two identifiers.  The patient is at work. I am in the office.   History of Present Illness:    Amanda Mooney is a 41 y.o. female [[id number]] who returns for follow up of who returns for follow up of postpartum hypertension   She has a medical history of of paroxysmal SVT which was diagnosed about 10 to 15 years ago, at that time the patient tells me that she was considered for ablation but first she was giving some metoprolol which helped in ablation was not pursued.     Of note did tell me at her initial visit that her last 2 pregnancies she has had multiple syncope episodes with the most recent pregnancy in her 43-year-old when she had 2 syncope episodes most of which she feels lightheaded, some palpitations dizziness and most times if she does not lie flat she would pass out.  Recently she has been able to control the syncope episodes by just lying flat when she experiences the symptoms.   During her visit given her palpitations and history of SVT I placed a monitor on the patient as well as get an echocardiogram.   I last saw the patient on November 14, 2020 at that time we continued her propanolol as needed.  Since I saw the patient she tells me that she has not had any syncope episodes and her propanolol has been aborting her SVT.  No other complaints at this time.   I saw the patient on March 25, 2021 at that time she was doing well from a cardiovascular standpoint.  Since I saw the patient she has delivered  and has been experiencing postpartum hypertension.  She is currently was on labetalol 200 mg daily.  And losartan 200 mg daily.   When I saw the patient via telehealth on June 11, 2021 I stop losartan and place the patient on valsartan at 160 mg daily.    During her June 21, 2021 visit I started the patient on carvedilol 3.125 mg twice daily and also increase her valsartan to 320 mg daily for  She is here today for follow-up visit.  She tells me that she has been doing well.  Her blood pressure has really been below target.  She is happy about this.  Unfortunately recently she has had thrombocytopenia initially before she had the baby her platelet count was low and it was low it was suspected to be associated with pregnancy induced thrombocytopenia.  She notes that recently this has come down.  Her PCP has referred her to hematology oncology.  Prior CV Studies Reviewed: The following studies were reviewed today:   Past Medical History:  Diagnosis Date   Anemia    Complication of anesthesia    " very hard to wake"   Gestational thrombocytopenia ([place])    H/O syncope 09/2020   followed by cardiology-- dr Raliegh Ip. Maniya Donovan;    during pregnancy ;  echo and  event monitor in epic 03/ 2022 ;  (06-11-2021 pt stated no syncopal or near syncope since 02/ 2022)   History of chlamydia    Hx of abnormal cervical Pap smear    Hypertension    Migraine    PONV (postoperative nausea and vomiting)    PSVT (paroxysmal supraventricular tachycardia) (HCC)    takes propranolol prn for palpitations   Wears glasses     Past Surgical History:  Procedure Laterality Date   CERVICAL DISC ARTHROPLASTY N/A 02/01/2019   Procedure: CERVICAL ANTERIOR DISC ARTHROPLASTY  C6-7;  Surgeon: Meade Maw, MD;  Location: [facility];  Service: Neurosurgery;  Laterality: N/A;   DILATION AND CURETTAGE OF UTERUS  2007   HERNIA REPAIR     infant  (type unknown)   LAPAROSCOPIC BILATERAL SALPINGECTOMY Bilateral 06/14/2021    Procedure: LAPAROSCOPIC BILATERAL SALPINGECTOMY;  Surgeon: Rowland Lathe, MD;  Location: [place];  Service: Gynecology;  Laterality: Bilateral;      OB History     Gravida  7   Para  4   Term  2   Preterm  2   AB  3   Living  4      SAB  1   IAB  2   Ectopic      Multiple  0   Live Births  2               Current Medications: Current Meds  Medication Sig   Aspirin-Acetaminophen-Caffeine (EXCEDRIN PO) Take by mouth as needed.   butalbital-acetaminophen-caffeine (FIORICET) 50-325-40 MG tablet Take 1 tablet by mouth 2 (two) times daily as needed for headache.   carvedilol (COREG) 3.125 MG tablet Take 1 tablet (3.125 mg total) by mouth 2 (two) times daily.   ibuprofen (ADVIL) 200 MG tablet Take 3 tablets (600 mg total) by mouth every 6 (six) hours as needed.   valsartan (DIOVAN) 320 MG tablet Take 1 tablet (320 mg total) by mouth daily.     Allergies:   Patient has no known allergies.   Social History   Socioeconomic History   Marital status: Married    Spouse name: Not on file   Number of children: Not on file   Years of education: Not on file   Highest education level: Not on file  Occupational History   Not on file  Tobacco Use   Smoking status: Never   Smokeless tobacco: Never  Vaping Use   Vaping Use: Never used  Substance and Sexual Activity   Alcohol use: No   Drug use: No   Sexual activity: Yes    Birth control/protection: None  Other Topics Concern   Not on file  Social History Narrative   ** Merged History Encounter **       Social Determinants of Health   Financial Resource Strain: Not on file  Food Insecurity: Not on file  Transportation Needs: Not on file  Physical Activity: Not on file  Stress: Not on file  Social Connections: Not on file      Family History  Problem Relation Age of Onset   Diabetes Mother    Hypertension Mother    Hypertension Father    Healthy Maternal Grandmother    Cancer  - Prostate Maternal Grandfather    Heart Problems Maternal Grandfather    Heart attack Paternal Grandmother    Cirrhosis Paternal Biochemist, clinical    Healthy Daughter    Healthy Daughter  Healthy Son       ROS:   Please see the history of present illness.     All other systems reviewed and are negative.   Labs/EKG Reviewed:    EKG:   EKG is was ordered today.  The ekg ordered today demonstrates   Recent Labs: 11/05/2020: ALT 19 04/06/2021: Platelets 55 06/14/2021: BUN 9; Creatinine, Ser 0.60; Hemoglobin 13.3; Potassium 3.3; Sodium 143   Recent Lipid Panel No results found for: CHOL, TRIG, HDL, CHOLHDL, LDLCALC, LDLDIRECT  Physical Exam:    VS:  BP 127/81    Pulse 95    Ht 5\' 1"  (1.549 m)    Wt 145 lb (65.8 kg)    LMP  (Within Weeks)    Breastfeeding Yes    BMI 27.40 kg/m     Wt Readings from Last 3 Encounters:  09/13/21 145 lb (65.8 kg)  06/21/21 154 lb 8 oz (70.1 kg)  06/14/21 152 lb 1.6 oz (69 kg)     GEN: Well nourished, well developed in no acute distress HEENT: Normal NECK: No JVD; No carotid bruits LYMPHATICS: No lymphadenopathy CARDIAC: RRR, no murmurs, rubs, gallops RESPIRATORY:  Clear to auscultation without rales, wheezing or rhonchi  ABDOMEN: Soft, non-tender, non-distended MUSCULOSKELETAL:  No edema; No deformity  SKIN: Warm and dry NEUROLOGIC:  Alert and oriented x 3 PSYCHIATRIC:  Normal affect    Risk Assessment/Risk Calculators:                 ASSESSMENT & PLAN:    Postpartum hypertension Paroxysmal SVT   She has had good control with the carvedilol 3.125 mg twice daily and valsartan 320 mg daily.  We will keep her on these medications for now. She is experiencing thrombocytopenia which I doubt this is secondary to her medication given the fact that she had thrombocytopenia prior.  But anyhow we will wait and see the opinion of the oncologist.  Explained to the patient that we will be open to transitioning medications  if her oncology wants her with any of her antihypertensive medications.  She expresses understanding.  The patient understands the need to lose weight with diet and exercise. We have discussed specific strategies for this.  Total visit time 10 minutes.  She will follow-up in 6 months.  Patient Instructions  Medication Instructions:  Your physician recommends that you continue on your current medications as directed. Please refer to the Current Medication list given to you today.  *If you need a refill on your cardiac medications before your next appointment, please call your pharmacy*  Lab Work: NONE ordered at this time of appointment   If you have labs (blood work) drawn today and your tests are completely normal, you will receive your results only by: [place] (if you have MyChart) OR A paper copy in the mail If you have any lab test that is abnormal or we need to change your treatment, we will call you to review the results.  Testing/Procedures: NONE ordered at this time of appointment   Follow-Up: At [facility], you and your health needs are our priority.  As part of our continuing mission to provide you with exceptional heart care, we have created designated Provider Care Teams.  These Care Teams include your primary Cardiologist (physician) and Advanced Practice Providers (APPs -  Physician Assistants and Nurse Practitioners) who all work together to provide you with the care you need, when you need it.  Your next appointment:   6 month(s)  The format for your next appointment:   In Person  Provider:   Berniece Salines, DO    Other Instructions    Dispo:  Return in about 6 months (around 03/13/2022).   Medication Adjustments/Labs and Tests Ordered: Current medicines are reviewed at length with the patient today.  Concerns regarding medicines are outlined above.  Tests Ordered: No orders of the defined types were placed in this encounter.  Medication  Changes: No orders of the defined types were placed in this encounter.

## 2021-09-13 NOTE — Telephone Encounter (Signed)
Called patient to discuss AVS instructions gave Dr. Terrial Rhodes recommendations and patient voiced understanding. AVS summary mailed to patient.

## 2021-09-25 ENCOUNTER — Other Ambulatory Visit: Payer: BC Managed Care – PPO

## 2021-09-25 ENCOUNTER — Encounter: Payer: BC Managed Care – PPO | Admitting: Physician Assistant

## 2021-09-27 ENCOUNTER — Inpatient Hospital Stay: Payer: BC Managed Care – PPO | Attending: Oncology | Admitting: Oncology

## 2021-09-27 DIAGNOSIS — A048 Other specified bacterial intestinal infections: Secondary | ICD-10-CM | POA: Diagnosis not present

## 2021-09-27 DIAGNOSIS — Z8042 Family history of malignant neoplasm of prostate: Secondary | ICD-10-CM | POA: Insufficient documentation

## 2021-09-27 DIAGNOSIS — Z79899 Other long term (current) drug therapy: Secondary | ICD-10-CM | POA: Insufficient documentation

## 2021-09-27 DIAGNOSIS — D696 Thrombocytopenia, unspecified: Secondary | ICD-10-CM | POA: Insufficient documentation

## 2021-10-01 ENCOUNTER — Telehealth: Payer: Self-pay | Admitting: Physician Assistant

## 2021-10-01 NOTE — Telephone Encounter (Signed)
Pt no showed her new hem appt. I called pt and r/s her to the next available day. Pt is aware of new appt date and time.

## 2021-10-18 ENCOUNTER — Inpatient Hospital Stay: Payer: BC Managed Care – PPO | Admitting: Physician Assistant

## 2021-10-18 ENCOUNTER — Encounter: Payer: Self-pay | Admitting: Physician Assistant

## 2021-10-18 ENCOUNTER — Other Ambulatory Visit: Payer: Self-pay

## 2021-10-18 ENCOUNTER — Inpatient Hospital Stay: Payer: BC Managed Care – PPO

## 2021-10-18 VITALS — BP 123/80 | HR 73 | Temp 97.7°F | Resp 20 | Wt 149.6 lb

## 2021-10-18 DIAGNOSIS — Z8042 Family history of malignant neoplasm of prostate: Secondary | ICD-10-CM

## 2021-10-18 DIAGNOSIS — D696 Thrombocytopenia, unspecified: Secondary | ICD-10-CM

## 2021-10-18 DIAGNOSIS — Z79899 Other long term (current) drug therapy: Secondary | ICD-10-CM | POA: Diagnosis not present

## 2021-10-18 LAB — CBC WITH DIFFERENTIAL (CANCER CENTER ONLY)
Abs Immature Granulocytes: 0.01 10*3/uL (ref 0.00–0.07)
Basophils Absolute: 0 10*3/uL (ref 0.0–0.1)
Basophils Relative: 0 %
Eosinophils Absolute: 0.1 10*3/uL (ref 0.0–0.5)
Eosinophils Relative: 1 %
HCT: 40.7 % (ref 36.0–46.0)
Hemoglobin: 13.2 g/dL (ref 12.0–15.0)
Immature Granulocytes: 0 %
Lymphocytes Relative: 19 %
Lymphs Abs: 1.4 10*3/uL (ref 0.7–4.0)
MCH: 29.4 pg (ref 26.0–34.0)
MCHC: 32.4 g/dL (ref 30.0–36.0)
MCV: 90.6 fL (ref 80.0–100.0)
Monocytes Absolute: 0.6 10*3/uL (ref 0.1–1.0)
Monocytes Relative: 8 %
Neutro Abs: 5.4 10*3/uL (ref 1.7–7.7)
Neutrophils Relative %: 72 %
Platelet Count: 157 10*3/uL (ref 150–400)
RBC: 4.49 MIL/uL (ref 3.87–5.11)
RDW: 13.3 % (ref 11.5–15.5)
WBC Count: 7.6 10*3/uL (ref 4.0–10.5)
nRBC: 0 % (ref 0.0–0.2)

## 2021-10-18 LAB — HEPATITIS C ANTIBODY: HCV Ab: NONREACTIVE

## 2021-10-18 LAB — CMP (CANCER CENTER ONLY)
ALT: 18 U/L (ref 0–44)
AST: 18 U/L (ref 15–41)
Albumin: 4.3 g/dL (ref 3.5–5.0)
Alkaline Phosphatase: 84 U/L (ref 38–126)
Anion gap: 6 (ref 5–15)
BUN: 12 mg/dL (ref 6–20)
CO2: 29 mmol/L (ref 22–32)
Calcium: 8.9 mg/dL (ref 8.9–10.3)
Chloride: 106 mmol/L (ref 98–111)
Creatinine: 0.75 mg/dL (ref 0.44–1.00)
GFR, Estimated: >60 mL/min (ref >60)
Glucose, Bld: 94 mg/dL (ref 70–99)
Potassium: 3.8 mmol/L (ref 3.5–5.1)
Sodium: 141 mmol/L (ref 135–145)
Total Bilirubin: 0.3 mg/dL (ref 0.3–1.2)
Total Protein: 7.5 g/dL (ref 6.5–8.1)

## 2021-10-18 LAB — HEPATITIS B CORE ANTIBODY, TOTAL: Hep B Core Total Ab: NONREACTIVE

## 2021-10-18 LAB — SAVE SMEAR(SSMR), FOR PROVIDER SLIDE REVIEW

## 2021-10-18 LAB — HEPATITIS B SURFACE ANTIGEN: Hepatitis B Surface Ag: NONREACTIVE

## 2021-10-18 LAB — IMMATURE PLATELET FRACTION: Immature Platelet Fraction: 4.5 % (ref 1.2–8.6)

## 2021-10-18 LAB — HIV ANTIBODY (ROUTINE TESTING W REFLEX): HIV Screen 4th Generation wRfx: NONREACTIVE

## 2021-10-18 LAB — HEPATITIS B SURFACE ANTIBODY,QUALITATIVE: Hep B S Ab: REACTIVE — AB

## 2021-10-18 LAB — VITAMIN B12: Vitamin B-12: 547 pg/mL (ref 180–914)

## 2021-10-18 LAB — FOLATE: Folate: 66 ng/mL (ref >5.9)

## 2021-10-18 NOTE — Progress Notes (Signed)
[place] [phone]   Fax:([phone]) [place] NOTE  Patient Care Team: London Pepper, MD as PCP - General (Family Medicine) Berniece Salines, DO as PCP - Cardiology (Cardiology)  Hematological/Oncological History 1) 04/04/2021-04/06/2021: Platelet count dropped from 75 K/uL to 55 K/uL during vaginal delivery and refractory to steroids.   2) 08/24/2021: Platelet count 45 K/uL. CT abdomen/pelvis showed no evidence of liver disease or splenomegaly.   3) 10/18/2021: Establish care with [facility]  CHIEF COMPLAINTS/PURPOSE OF CONSULTATION:  "Thrombocytopenia "  HISTORY OF PRESENTING ILLNESS:  Amanda Mooney 41 y.o. female with medical history significant for hypertension, paroxysmal SVT, history of syncope, and gestational thrombocytopenia.  Patient is unaccompanied for this visit.  On exam today, Amanda Mooney reports chronic fatigue but she continues to complete all her daily activities on her own.  Patient has a good appetite without any recent weight changes.  She has intermittent episodes of nausea, up to 2 episodes per week, and is triggered by migraines.  She denies any vomiting episodes.  Patient was recently diagnosed with H. pylori infection is currently on 14-day course of Flagyl.  Due to antibiotic therapy, patient's stools have softened and she is having bowel movement more frequently.  She denies any signs of active bleeding but does notice easy bruising predominantly in her extremities.  She denies any fevers, chills, night sweats, shortness of breath, chest pain or cough.  He has no other complaints.  Remaining 10 point ROS is below.  MEDICAL HISTORY:  Past Medical History:  Diagnosis Date   Anemia    Complication of anesthesia    " very hard to wake"   Gestational thrombocytopenia ([place])    H/O syncope 09/2020   followed by cardiology-- dr Raliegh Ip. [place];    during pregnancy ;  echo and  event monitor in epic 03/ 2022 ;  (06-11-2021 pt stated  no syncopal or near syncope since 02/ 2022)   History of chlamydia    Hx of abnormal cervical Pap smear    Hypertension    post pregnancy   Migraine    PONV (postoperative nausea and vomiting)    PSVT (paroxysmal supraventricular tachycardia) ([place])    takes propranolol prn for palpitations   Wears glasses     SURGICAL HISTORY: Past Surgical History:  Procedure Laterality Date   CERVICAL DISC ARTHROPLASTY N/A 02/01/2019   Procedure: CERVICAL ANTERIOR DISC ARTHROPLASTY  C6-7;  Surgeon: Meade Maw, MD;  Location: [facility];  Service: Neurosurgery;  Laterality: N/A;   DILATION AND CURETTAGE OF UTERUS  2007   HERNIA REPAIR     infant  (type unknown)   LAPAROSCOPIC BILATERAL SALPINGECTOMY Bilateral 06/14/2021   Procedure: LAPAROSCOPIC BILATERAL SALPINGECTOMY;  Surgeon: Rowland Lathe, MD;  Location: [place];  Service: Gynecology;  Laterality: Bilateral;    SOCIAL HISTORY: Social History   Socioeconomic History   Marital status: Married    Spouse name: Not on file   Number of children: Not on file   Years of education: Not on file   Highest education level: Not on file  Occupational History   Not on file  Tobacco Use   Smoking status: Never   Smokeless tobacco: Never  Vaping Use   Vaping Use: Never used  Substance and Sexual Activity   Alcohol use: Yes    Comment: occasionally   Drug use: No   Sexual activity: Yes    Birth control/protection: None  Other Topics Concern   Not  on file  Social History Narrative   ** Merged History Encounter **       Social Determinants of Health   Financial Resource Strain: Not on file  Food Insecurity: Not on file  Transportation Needs: Not on file  Physical Activity: Not on file  Stress: Not on file  Social Connections: Not on file  Intimate Partner Violence: Not on file    FAMILY HISTORY: Family History  Problem Relation Age of Onset   Diabetes Mother    Hypertension Mother    Hypertension  Father    Prostate cancer Father    Healthy Brother    Healthy Maternal Grandmother    Cancer - Prostate Maternal Grandfather    Heart Problems Maternal Grandfather    Heart attack Paternal Grandmother    Cirrhosis Paternal Programmer, applications Daughter    Healthy Daughter    Healthy Son     ALLERGIES:  has No Known Allergies.  MEDICATIONS:  Current Outpatient Medications  Medication Sig Dispense Refill   Aspirin-Acetaminophen-Caffeine (EXCEDRIN PO) Take by mouth as needed.     butalbital-acetaminophen-caffeine (FIORICET) 50-325-40 MG tablet Take 1 tablet by mouth 2 (two) times daily as needed for headache.     ibuprofen (ADVIL) 200 MG tablet Take 3 tablets (600 mg total) by mouth every 6 (six) hours as needed.     metroNIDAZOLE (FLAGYL) 250 MG tablet Take 250 mg by mouth 3 (three) times daily.     valsartan (DIOVAN) 320 MG tablet Take 1 tablet (320 mg total) by mouth daily. 90 tablet 3   carvedilol (COREG) 3.125 MG tablet Take 1 tablet (3.125 mg total) by mouth 2 (two) times daily. 180 tablet 3   No current facility-administered medications for this visit.    REVIEW OF SYSTEMS:   Constitutional: ( - ) fevers, ( - )  chills , ( - ) night sweats Eyes: ( - ) blurriness of vision, ( - ) double vision, ( - ) watery eyes Ears, nose, mouth, throat, and face: ( - ) mucositis, ( - ) sore throat Respiratory: ( - ) cough, ( - ) dyspnea, ( - ) wheezes Cardiovascular: ( - ) palpitation, ( - ) chest discomfort, ( - ) lower extremity swelling Gastrointestinal:  ( - ) nausea, ( - ) heartburn, ( - ) change in bowel habits Skin: ( - ) abnormal skin rashes Lymphatics: ( - ) new lymphadenopathy, ( + ) easy bruising Neurological: ( - ) numbness, ( - ) tingling, ( - ) new weaknesses Behavioral/Psych: ( - ) mood change, ( - ) new changes  All other systems were reviewed with the patient and are negative.  PHYSICAL EXAMINATION: ECOG PERFORMANCE STATUS: 1 - Symptomatic but completely  ambulatory  Vitals:   10/18/21 1110  BP: 123/80  Pulse: 73  Resp: 20  Temp: 97.7 F (36.5 C)  SpO2: 100%   Filed Weights   10/18/21 1110  Weight: 149 lb 9.6 oz (67.9 kg)    GENERAL: well appearing female in NAD  SKIN: skin color, texture, turgor are normal, no rashes or significant lesions EYES: conjunctiva are pink and non-injected, sclera clear OROPHARYNX: no exudate, no erythema; lips, buccal mucosa, and tongue normal  NECK: supple, non-tender LYMPH:  no palpable lymphadenopathy in the cervical or supraclavicular lymph nodes.  LUNGS: clear to auscultation and percussion with normal breathing effort HEART: regular rate & rhythm and no murmurs and no lower extremity edema ABDOMEN: soft, non-tender, non-distended, normal bowel sounds Musculoskeletal:  no cyanosis of digits and no clubbing  PSYCH: alert & oriented x 3, fluent speech NEURO: no focal motor/sensory deficits  LABORATORY DATA:  I have reviewed the data as listed CBC Latest Ref Rng & Units 10/18/2021 06/14/2021 04/06/2021  WBC 4.0 - 10.5 K/uL 7.6 - 9.7  Hemoglobin 12.0 - 15.0 g/dL 13.2 13.3 9.9(L)  Hematocrit 36.0 - 46.0 % 40.7 39.0 30.7(L)  Platelets 150 - 400 K/uL 157 - 55(L)    CMP Latest Ref Rng & Units 10/18/2021 06/14/2021 11/05/2020  Glucose 70 - 99 mg/dL 94 77 79  BUN 6 - 20 mg/dL 12 9 12   Creatinine 0.44 - 1.00 mg/dL 0.75 0.60 0.45  Sodium 135 - 145 mmol/L 141 143 136  Potassium 3.5 - 5.1 mmol/L 3.8 3.3(L) 4.0  Chloride 98 - 111 mmol/L 106 104 105  CO2 22 - 32 mmol/L 29 - 23  Calcium 8.9 - 10.3 mg/dL 8.9 - 8.6(L)  Total Protein 6.5 - 8.1 g/dL 7.5 - 6.8  Total Bilirubin 0.3 - 1.2 mg/dL 0.3 - 0.5  Alkaline Phos 38 - 126 U/L 84 - 45  AST 15 - 41 U/L 18 - 17  ALT 0 - 44 U/L 18 - 19   RADIOGRAPHIC STUDIES: I have personally reviewed the radiological images as listed and agreed with the findings in the report. No results found.  ASSESSMENT & PLAN Amanda Mooney is a 41 y.o. who presents to the clinic  for thrombocytopenia, suspicious for ITP.  I reviewed potential etiologies for thrombocytopenia including liver disease, splenomegaly, infectious processes, nutritional anemias, immune mediated and bone marrow disorders. Patient notes that she is currently taking under treatment for H.pylori infection. I explained that there is evidence of ITP associated with H.pylori infection.  Patient will proceed with serologic workup today to check CBC, CMP, immature platelet fraction, Vitamin B12, MMA, folate, Hepatitis B and C serologies, HIV serology, and save smear.   #Thrombocytopenia: --Labs today to check CBC w/diff, CMP, immature platelet fraction, B12 level, folate level, Hep B and C serologies, HIV serology. --Outside CT abdomen/pelvis from 08/24/2021 showed no evidence of liver disease and/or splenomegaly.  --Suspect ITP that flared up from recent H.pylori infection.  --Plan to treat with dexamethasone 40 mg x 4 days if Plts are less than 30 K/uL or patient reports signs of active bleeding.  --RTC in 6 months with repeat labs  Orders Placed This Encounter  Procedures   CBC with Differential ([place] Only)    Standing Status:   Future    Number of Occurrences:   1    Standing Expiration Date:   10/17/2022   Immature Platelet Fraction    Standing Status:   Future    Number of Occurrences:   1    Standing Expiration Date:   10/17/2022   CMP ([place] only)    Standing Status:   Future    Number of Occurrences:   1    Standing Expiration Date:   10/17/2022   Vitamin B12    Standing Status:   Future    Number of Occurrences:   1    Standing Expiration Date:   10/18/2022   Methylmalonic acid, serum    Standing Status:   Future    Number of Occurrences:   1    Standing Expiration Date:   10/18/2022   Folate, Serum    Standing Status:   Future    Number of Occurrences:   1    Standing Expiration  Date:   10/18/2022   Hepatitis B core antibody, total    Standing Status:   Future     Number of Occurrences:   1    Standing Expiration Date:   10/18/2022   Hepatitis B surface antibody    Standing Status:   Future    Number of Occurrences:   1    Standing Expiration Date:   10/18/2022   Hepatitis B surface antigen    Standing Status:   Future    Number of Occurrences:   1    Standing Expiration Date:   10/18/2022   Hepatitis C antibody    Standing Status:   Future    Number of Occurrences:   1    Standing Expiration Date:   10/18/2022   HIV antibody (with reflex)    Standing Status:   Future    Number of Occurrences:   1    Standing Expiration Date:   10/18/2022   Save Smear [facility])    Standing Status:   Future    Number of Occurrences:   1    Standing Expiration Date:   10/18/2022    All questions were answered. The patient knows to call the clinic with any problems, questions or concerns.  I have spent a total of 60 minutes minutes of face-to-face and non-face-to-face time, preparing to see the patient, obtaining and/or reviewing separately obtained history, performing a medically appropriate examination, counseling and educating the patient, ordering tests, documenting clinical information in the electronic health record, and care coordination.   Dede Query, PA-C Department of Hematology/Oncology [place] at [facility] Phone: [phone]  Patient was seen with Dr. Lorenso Courier  I have read the above note and personally examined the patient. I agree with the assessment and plan as noted above.  Briefly Amanda Mooney is a 41 year old female who presents for evaluation of thrombocytopenia.  The patient was thrombocytopenic during her pregnancy and this was thought to be a gestational thrombocytopenia.  Unfortunately her levels were well below 100 and persisted after delivery.  As such her findings appear more consistent with ITP.  Interestingly she is currently undergoing evaluation for H. pylori and is currently on treatment with metronidazole  therapy.  H. pylori has been known to cause thrombocytopenia and treatment of this infection may actually improve her platelet count.  If not this likely represents ITP and a challenge of steroids could be both therapeutic and diagnostic if her levels drop below 30.  The patient voiced understanding of the possible diagnoses.   Ledell Peoples, MD Department of Hematology/Oncology [place] at [facility] Phone: [phone] Pager: [phone] Email: [email or web address]

## 2021-10-21 ENCOUNTER — Telehealth: Payer: Self-pay | Admitting: Hematology and Oncology

## 2021-10-21 NOTE — Telephone Encounter (Signed)
Scheduled per 2/24 los, pt has been called and confirmed

## 2021-10-31 ENCOUNTER — Telehealth: Payer: Self-pay | Admitting: Physician Assistant

## 2021-10-31 LAB — METHYLMALONIC ACID, SERUM: Methylmalonic Acid, Quantitative: 114 nmol/L (ref 0–378)

## 2021-10-31 NOTE — Telephone Encounter (Signed)
I called [name] and review lab results from our consultation on 10/18/2021. Labs show that platelet count has recovered back to normal. Remaining workup was negative. Suspect ITP with recent episode of thrombocytopenia due to H.pylori infection. No further workup is required at this time. Plan to monitor labs and see patient back in clinic in 6 months.  ?Patient expressed understanding and satisfaction with the plan provided.  ?

## 2022-03-31 DIAGNOSIS — U071 COVID-19: Secondary | ICD-10-CM | POA: Diagnosis not present

## 2022-04-17 ENCOUNTER — Telehealth: Payer: Self-pay | Admitting: *Deleted

## 2022-04-17 NOTE — Telephone Encounter (Signed)
TCT patient regarding her appt on 04/18/22. Spoke to her. Advised that since she was diagnosed with Covid on 03/31/22 we cannot see her in this clinic for 21 days after diagnosis. Advised that was our policy and that we will get rescheduled as soon as possible. Pt was very understanding and is ok getting rescheduled.  Scheduling message sent

## 2022-04-18 ENCOUNTER — Telehealth: Payer: Self-pay | Admitting: Hematology and Oncology

## 2022-04-18 ENCOUNTER — Inpatient Hospital Stay: Payer: BC Managed Care – PPO | Admitting: Hematology and Oncology

## 2022-04-18 ENCOUNTER — Inpatient Hospital Stay: Payer: BC Managed Care – PPO

## 2022-04-18 NOTE — Telephone Encounter (Signed)
Contacted patient to scheduled appointments. Patient is aware of appointments that are scheduled.   

## 2022-04-21 DIAGNOSIS — R059 Cough, unspecified: Secondary | ICD-10-CM | POA: Diagnosis not present

## 2022-04-21 DIAGNOSIS — J988 Other specified respiratory disorders: Secondary | ICD-10-CM | POA: Diagnosis not present

## 2022-05-08 ENCOUNTER — Inpatient Hospital Stay: Payer: BC Managed Care – PPO | Admitting: Hematology and Oncology

## 2022-05-08 ENCOUNTER — Inpatient Hospital Stay: Payer: BC Managed Care – PPO | Attending: Hematology and Oncology

## 2022-05-08 ENCOUNTER — Other Ambulatory Visit: Payer: Self-pay | Admitting: Hematology and Oncology

## 2022-05-08 ENCOUNTER — Other Ambulatory Visit: Payer: Self-pay

## 2022-05-08 VITALS — BP 136/83 | HR 91 | Temp 98.1°F | Resp 16 | Wt 157.2 lb

## 2022-05-08 DIAGNOSIS — D696 Thrombocytopenia, unspecified: Secondary | ICD-10-CM

## 2022-05-08 DIAGNOSIS — Z79899 Other long term (current) drug therapy: Secondary | ICD-10-CM | POA: Insufficient documentation

## 2022-05-08 LAB — CBC WITH DIFFERENTIAL (CANCER CENTER ONLY)
Abs Immature Granulocytes: 0.02 10*3/uL (ref 0.00–0.07)
Basophils Absolute: 0 10*3/uL (ref 0.0–0.1)
Basophils Relative: 0 %
Eosinophils Absolute: 0.2 10*3/uL (ref 0.0–0.5)
Eosinophils Relative: 2 %
HCT: 35.7 % — ABNORMAL LOW (ref 36.0–46.0)
Hemoglobin: 11.5 g/dL — ABNORMAL LOW (ref 12.0–15.0)
Immature Granulocytes: 0 %
Lymphocytes Relative: 19 %
Lymphs Abs: 1.6 10*3/uL (ref 0.7–4.0)
MCH: 29 pg (ref 26.0–34.0)
MCHC: 32.2 g/dL (ref 30.0–36.0)
MCV: 90.2 fL (ref 80.0–100.0)
Monocytes Absolute: 0.7 10*3/uL (ref 0.1–1.0)
Monocytes Relative: 8 %
Neutro Abs: 5.9 10*3/uL (ref 1.7–7.7)
Neutrophils Relative %: 71 %
Platelet Count: 234 10*3/uL (ref 150–400)
RBC: 3.96 MIL/uL (ref 3.87–5.11)
RDW: 13.9 % (ref 11.5–15.5)
WBC Count: 8.4 10*3/uL (ref 4.0–10.5)
nRBC: 0 % (ref 0.0–0.2)

## 2022-05-08 LAB — CMP (CANCER CENTER ONLY)
ALT: 9 U/L (ref 0–44)
AST: 15 U/L (ref 15–41)
Albumin: 4.1 g/dL (ref 3.5–5.0)
Alkaline Phosphatase: 67 U/L (ref 38–126)
Anion gap: 4 — ABNORMAL LOW (ref 5–15)
BUN: 10 mg/dL (ref 6–20)
CO2: 30 mmol/L (ref 22–32)
Calcium: 8.9 mg/dL (ref 8.9–10.3)
Chloride: 105 mmol/L (ref 98–111)
Creatinine: 0.75 mg/dL (ref 0.44–1.00)
GFR, Estimated: >60 mL/min (ref >60)
Glucose, Bld: 89 mg/dL (ref 70–99)
Potassium: 4.1 mmol/L (ref 3.5–5.1)
Sodium: 139 mmol/L (ref 135–145)
Total Bilirubin: 0.4 mg/dL (ref 0.3–1.2)
Total Protein: 6.9 g/dL (ref 6.5–8.1)

## 2022-05-08 LAB — IMMATURE PLATELET FRACTION: Immature Platelet Fraction: 3.1 % (ref 1.2–8.6)

## 2022-05-08 NOTE — Progress Notes (Signed)
[facility] [phone]   [phone]  PROGRESS NOTE  Patient Care Team: London Pepper, MD as PCP - General (Family Medicine) Berniece Salines, DO as PCP - Cardiology (Cardiology)  Hematological/Oncological History # Thrombocytopenia, ITP vs Gestational Thrombocytopenia 1) 04/04/2021-04/06/2021: Platelet count dropped from 75 K/uL to 55 K/uL during vaginal delivery and refractory to steroids.  2) 08/24/2021: Platelet count 45 K/uL. CT abdomen/pelvis showed no evidence of liver disease or splenomegaly.  3) 10/18/2021: Establish care with [facility]  Interval History:  Amanda Mooney 41 y.o. female with medical history significant for thrombocytopenia who presents for a follow up visit. The patient's last visit was on 10/18/2021 at which time she establish care. In the interim since the last visit her platelets have subsequently normalized.  On exam today Amanda Mooney reports that she has been well in the interim since her last visit.  Unfortunately she twisted her foot and fractured her left ankle.  She is currently wearing a boot which will be removed in 2 weeks time.  She has not been having any difficulty with bleeding, bruising, or dark stools.  She reports that her menstrual cycles have been regular and not heavy or excessive.  She reports that her child is now 54 months old and is doing quite well.  The child is not walking.  She reports that she had "my tubes taken out" and that she would not be able to have any more children.  She currently denies any recent infections, fevers, chills, sweats, nausea, vomiting or diarrhea.  Full 10 point ROS is listed below.  MEDICAL HISTORY:  Past Medical History:  Diagnosis Date   Anemia    Complication of anesthesia    " very hard to wake"   Gestational thrombocytopenia ([place])    H/O syncope 09/2020   followed by cardiology-- dr Raliegh Ip. tobb;    during pregnancy ;  echo and  event monitor in epic 03/ 2022 ;  (06-11-2021  pt stated no syncopal or near syncope since 02/ 2022)   History of chlamydia    Hx of abnormal cervical Pap smear    Hypertension    post pregnancy   Migraine    PONV (postoperative nausea and vomiting)    PSVT (paroxysmal supraventricular tachycardia) ([place])    takes propranolol prn for palpitations   Wears glasses     SURGICAL HISTORY: Past Surgical History:  Procedure Laterality Date   CERVICAL DISC ARTHROPLASTY N/A 02/01/2019   Procedure: CERVICAL ANTERIOR DISC ARTHROPLASTY  C6-7;  Surgeon: Meade Maw, MD;  Location: [facility]S;  Service: Neurosurgery;  Laterality: N/A;   DILATION AND CURETTAGE OF UTERUS  2007   HERNIA REPAIR     infant  (type unknown)   LAPAROSCOPIC BILATERAL SALPINGECTOMY Bilateral 06/14/2021   Procedure: LAPAROSCOPIC BILATERAL SALPINGECTOMY;  Surgeon: Rowland Lathe, MD;  Location: [facility];  Service: Gynecology;  Laterality: Bilateral;    SOCIAL HISTORY: Social History   Socioeconomic History   Marital status: Married    Spouse name: Not on file   Number of children: Not on file   Years of education: Not on file   Highest education level: Not on file  Occupational History   Not on file  Tobacco Use   Smoking status: Never   Smokeless tobacco: Never  Vaping Use   Vaping Use: Never used  Substance and Sexual Activity   Alcohol use: Yes    Comment: occasionally   Drug use: No  Sexual activity: Yes    Birth control/protection: None  Other Topics Concern   Not on file  Social History Narrative   ** Merged History Encounter **       Social Determinants of Health   Financial Resource Strain: Not on file  Food Insecurity: Not on file  Transportation Needs: Not on file  Physical Activity: Not on file  Stress: Not on file  Social Connections: Not on file  Intimate Partner Violence: Not on file    FAMILY HISTORY: Family History  Problem Relation Age of Onset   Diabetes Mother    Hypertension Mother     Hypertension Father    Prostate cancer Father    Healthy Brother    Healthy Maternal Grandmother    Cancer - Prostate Maternal Grandfather    Heart Problems Maternal Grandfather    Heart attack Paternal Grandmother    Cirrhosis Paternal Programmer, applications Daughter    Healthy Daughter    Healthy Son     ALLERGIES:  has No Known Allergies.  MEDICATIONS:  Current Outpatient Medications  Medication Sig Dispense Refill   Aspirin-Acetaminophen-Caffeine (EXCEDRIN PO) Take by mouth as needed.     butalbital-acetaminophen-caffeine (FIORICET) 50-325-40 MG tablet Take 1 tablet by mouth 2 (two) times daily as needed for headache.     carvedilol (COREG) 3.125 MG tablet Take 1 tablet (3.125 mg total) by mouth 2 (two) times daily. 180 tablet 3   ibuprofen (ADVIL) 200 MG tablet Take 3 tablets (600 mg total) by mouth every 6 (six) hours as needed.     metroNIDAZOLE (FLAGYL) 250 MG tablet Take 250 mg by mouth 3 (three) times daily.     valsartan (DIOVAN) 320 MG tablet Take 1 tablet (320 mg total) by mouth daily. 90 tablet 3   No current facility-administered medications for this visit.    REVIEW OF SYSTEMS:   Constitutional: ( - ) fevers, ( - )  chills , ( - ) night sweats Eyes: ( - ) blurriness of vision, ( - ) double vision, ( - ) watery eyes Ears, nose, mouth, throat, and face: ( - ) mucositis, ( - ) sore throat Respiratory: ( - ) cough, ( - ) dyspnea, ( - ) wheezes Cardiovascular: ( - ) palpitation, ( - ) chest discomfort, ( - ) lower extremity swelling Gastrointestinal:  ( - ) nausea, ( - ) heartburn, ( - ) change in bowel habits Skin: ( - ) abnormal skin rashes Lymphatics: ( - ) new lymphadenopathy, ( - ) easy bruising Neurological: ( - ) numbness, ( - ) tingling, ( - ) new weaknesses Behavioral/Psych: ( - ) mood change, ( - ) new changes  All other systems were reviewed with the patient and are negative.  PHYSICAL EXAMINATION:  Vitals:   05/08/22 1511  BP: 136/83  Pulse: 91   Resp: 16  Temp: 98.1 F (36.7 C)  SpO2: 100%   Filed Weights   05/08/22 1511  Weight: 157 lb 3.2 oz (71.3 kg)    GENERAL: Well-appearing middle-aged African-American female, alert, no distress and comfortable SKIN: skin color, texture, turgor are normal, no rashes or significant lesions EYES: conjunctiva are pink and non-injected, sclera clear LUNGS: clear to auscultation and percussion with normal breathing effort HEART: regular rate & rhythm and no murmurs and no lower extremity edema Musculoskeletal: no cyanosis of digits and no clubbing  PSYCH: alert & oriented x 3, fluent speech NEURO: no focal motor/sensory deficits  LABORATORY DATA:  I have reviewed the data as listed    Latest Ref Rng & Units 05/08/2022    2:51 PM 10/18/2021   12:29 PM 06/14/2021   11:14 AM  CBC  WBC 4.0 - 10.5 K/uL 8.4  7.6    Hemoglobin 12.0 - 15.0 g/dL 11.5  13.2  13.3   Hematocrit 36.0 - 46.0 % 35.7  40.7  39.0   Platelets 150 - 400 K/uL 234  157         Latest Ref Rng & Units 05/08/2022    2:51 PM 10/18/2021   12:29 PM 06/14/2021   11:14 AM  CMP  Glucose 70 - 99 mg/dL 89  94  77   BUN 6 - 20 mg/dL '10  12  9   '$ Creatinine 0.44 - 1.00 mg/dL 0.75  0.75  0.60   Sodium 135 - 145 mmol/L 139  141  143   Potassium 3.5 - 5.1 mmol/L 4.1  3.8  3.3   Chloride 98 - 111 mmol/L 105  106  104   CO2 22 - 32 mmol/L 30  29    Calcium 8.9 - 10.3 mg/dL 8.9  8.9    Total Protein 6.5 - 8.1 g/dL 6.9  7.5    Total Bilirubin 0.3 - 1.2 mg/dL 0.4  0.3    Alkaline Phos 38 - 126 U/L 67  84    AST 15 - 41 U/L 15  18    ALT 0 - 44 U/L 9  18      RADIOGRAPHIC STUDIES: No results found.  ASSESSMENT & PLAN Amanda ILLANA Mooney 41 y.o. female with medical history significant for thrombocytopenia who presents for a follow up visit.  #Thrombocytopenia: --Platelets have subsequently completely normalized.  At this time the etiology is thought to be either ITP or gestational thrombocytopenia.  This would be an unusual pattern  gestational thrombocytopenia.  ITP may have been flared by H. pylori infection. --Outside CT abdomen/pelvis from 08/24/2021 showed no evidence of liver disease and/or splenomegaly.  -- Labs today show white blood cell count 8.4, hemoglobin 9.5, MCV 90.2, and platelets of 234 --Plan to treat with dexamethasone 40 mg x 4 days if Plts are less than 30 K/uL or patient reports signs of active bleeding.  --RTC in 12 months (offered the patient as needed follow-up but she would like to continue to follow in our clinic)    No orders of the defined types were placed in this encounter.   All questions were answered. The patient knows to call the clinic with any problems, questions or concerns.  A total of more than 30 minutes were spent on this encounter with face-to-face time and non-face-to-face time, including preparing to see the patient, ordering tests and/or medications, counseling the patient and coordination of care as outlined above.   Ledell Peoples, MD Department of Hematology/Oncology [place] at [facility] Phone: [phone] Pager: [phone] Email: [email or web address]  05/08/2022 5:21 PM

## 2022-06-25 DIAGNOSIS — Z419 Encounter for procedure for purposes other than remedying health state, unspecified: Secondary | ICD-10-CM | POA: Diagnosis not present

## 2022-10-19 IMAGING — US US MFM OB TRANSVAGINAL
1 series · 15 of 28 positions shown · non-contrast
Comparison: none

[Series 1: us mfm ob transvaginal · 34 acquisitions, 15 frames shown]
[im 1/34]
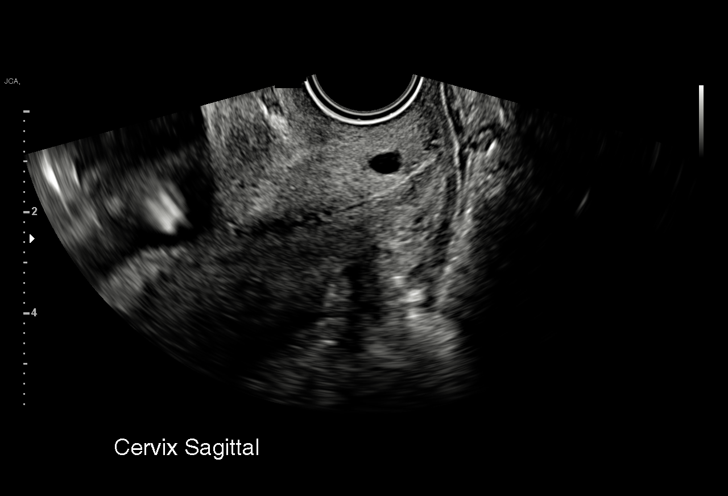
[im 3/34]
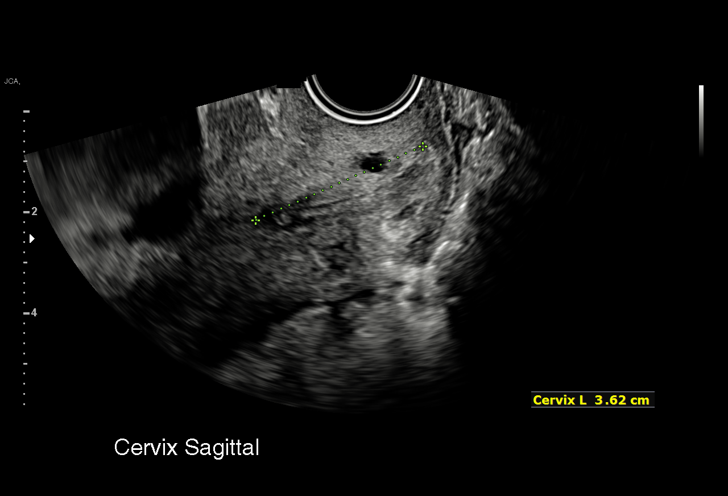
[im 5/34]
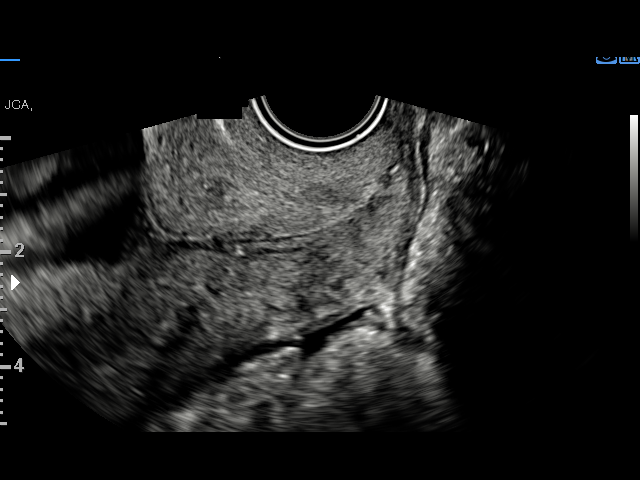
[im 8/34]
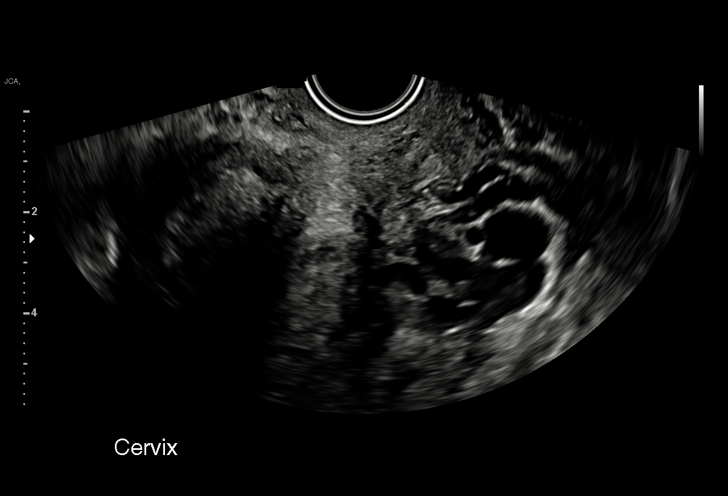
[im 10/34]
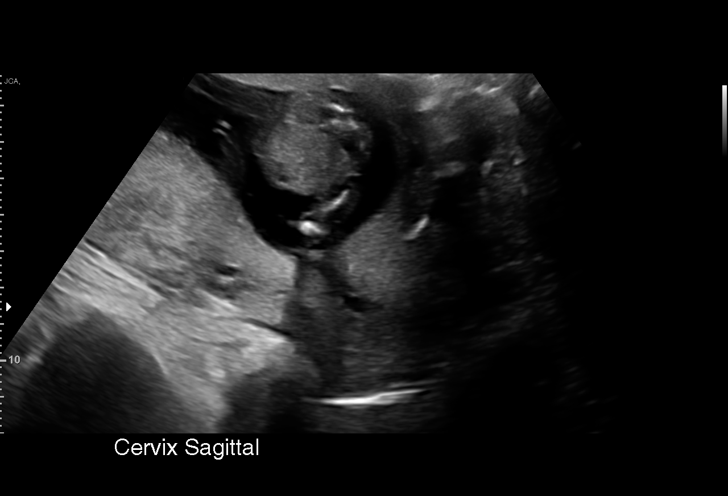
[im 13/34]
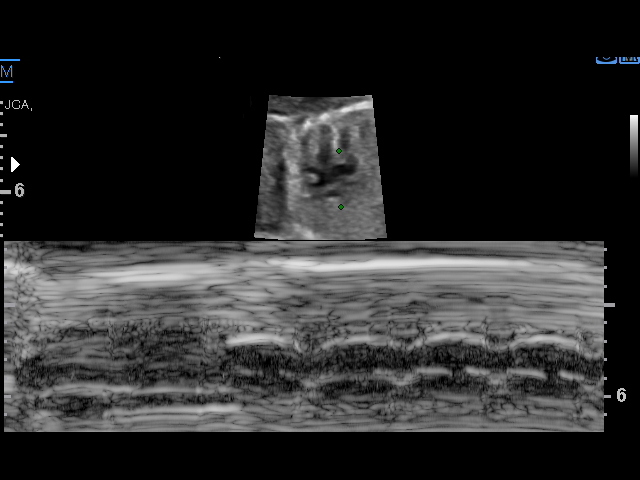
[im 15/34]
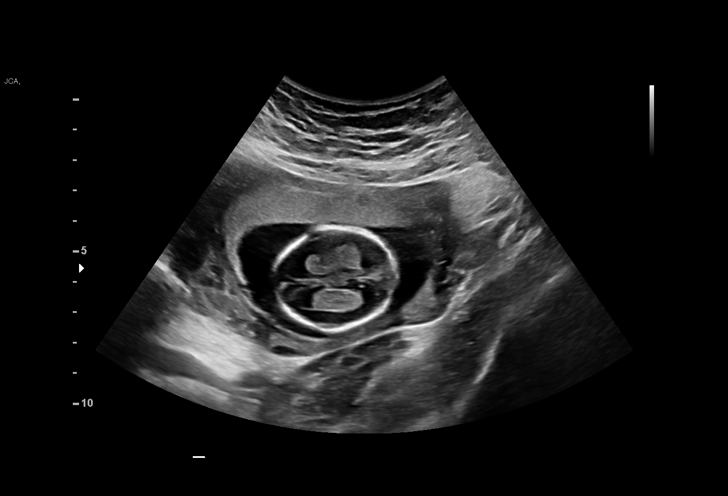
[im 18/34]
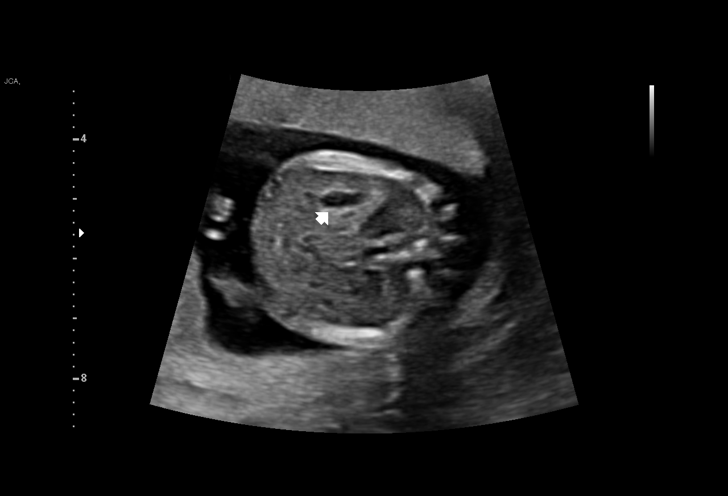
[im 19/34]
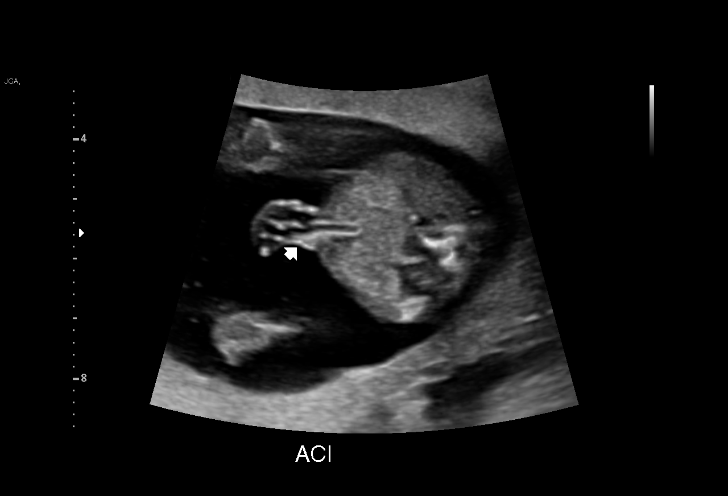
[im 21/34]
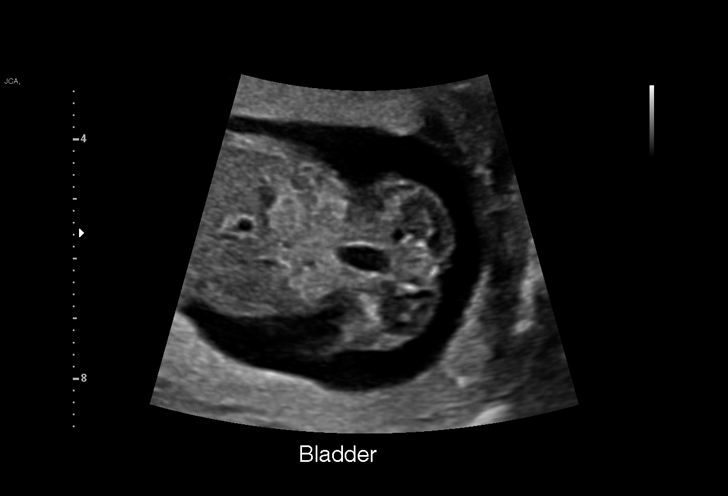
[im 24/34]
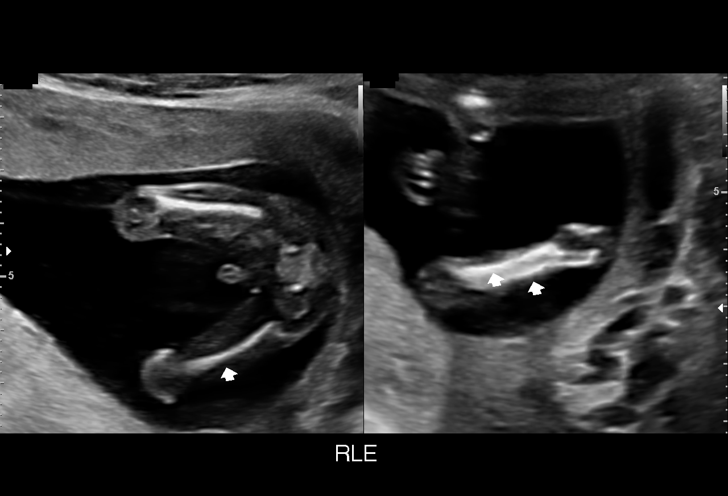
[im 26/34]
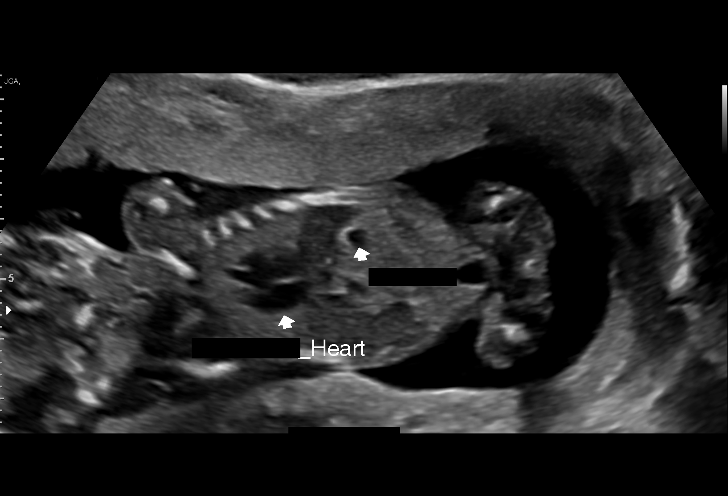
[im 29/34]
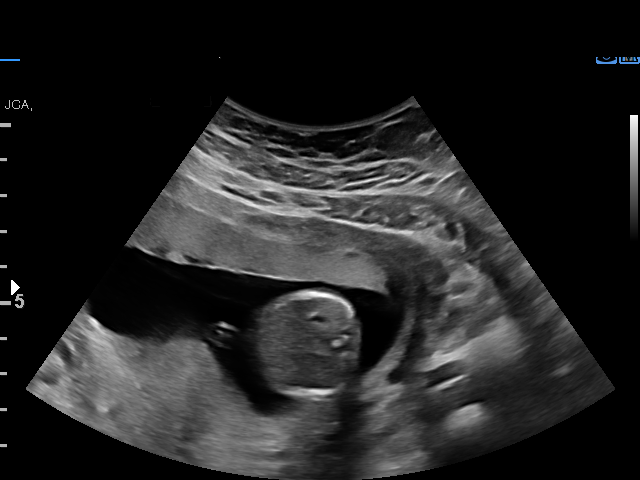
[im 31/34]
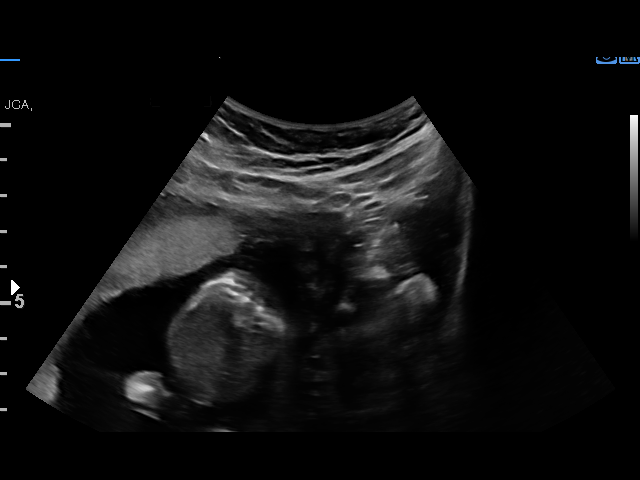
[im 34/34]
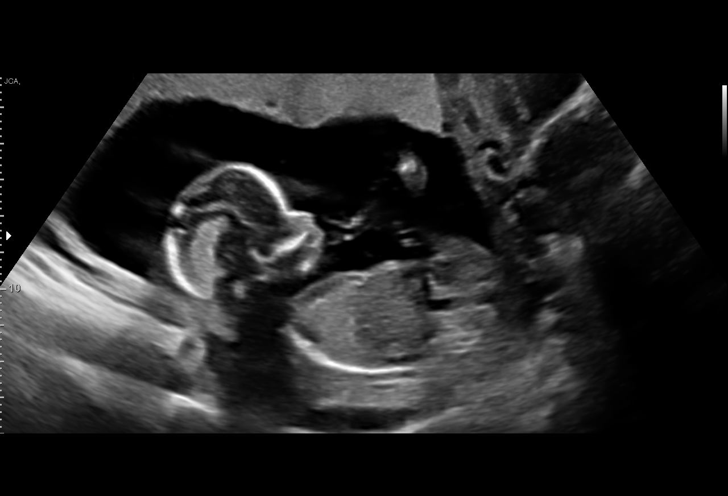

[15 of 28 positions shown; findings below may reference images not displayed]

OBGYN
                                                            [REDACTED]
                   TINUSZ

 1  US MFM OB TRANSVAGINAL                76817.2     KYLAN SINN

Indications

 Advanced maternal age multigravida 35+,
 second trimester
 Poor obstetric history: Previous preterm
 delivery, antepartum (35 &14weeks)
 Encounter for cervical length
 16 weeks gestation of pregnancy
 Low Risk NIPS, Neg AFP
 Heart disease in miother affecting pregnancy
 in second trimester (SVT)
Fetal Evaluation

 Num Of Fetuses:         1
 Fetal Heart Rate(bpm):  157
 Cardiac Activity:       Observed
 Presentation:           Breech
 Placenta:               Anterior
 P. Cord Insertion:      Not well visualized

 Amniotic Fluid
 AFI FV:      Within normal limits
OB History

 Gravidity:    7         Term:   1        Prem:   2
 Living:       3
Gestational Age

 Best:          16w 0d     Det. By:  Previous Ultrasound      EDD:   04/11/21
                                     (09/07/20)
Cervix Uterus Adnexa

 Cervix
 Length:            3.6  cm.
 Normal appearance by transvaginal scan
Impression

 G7 P3.  Patient is here for transvaginal ultrasound to
 measure cervical length.  She has history of 2 preterm
 vaginal deliveries (35/36 weeks) and her most recent
 pregnancy ended in term vaginal delivery.  Patient did not
 take prophylactic progesterone in that pregnancy.
 On cell free fetal DNA screening, the risks of fetal
 aneuploidies are not increased.
 Patient has a diagnosis of supraventricular tachycardia and
 was seen by her cardiologist last month.  She takes
 metoprolol 20 mg p.o. daily.
 A limited ultrasound study was performed.  Amniotic fluid is
 normal and good fetal activity is seen.  No obvious fetal
 structural defects are seen.  No detailed fetal anatomical
 survey was performed.
 After explaining, we performed transvaginal ultrasound to
 evaluate the cervix.  Internal loss is difficult to define.
 However, the cervical length measurement appears to be
 cm, which is within normal range.  No shortening was seen
 on transfundal pressure.
 We reassured the patient of the findings.
Recommendations

 -An appointment was made for her to return in 4 weeks for
 detailed fetal anatomical survey and cervical length
 measurement (transvaginal ultrasound).
                 Aujla, Blade

## 2023-05-07 ENCOUNTER — Other Ambulatory Visit: Payer: Self-pay | Admitting: Physician Assistant

## 2023-05-07 DIAGNOSIS — D696 Thrombocytopenia, unspecified: Secondary | ICD-10-CM

## 2023-05-08 ENCOUNTER — Other Ambulatory Visit: Payer: BC Managed Care – PPO

## 2023-05-08 ENCOUNTER — Ambulatory Visit: Payer: BC Managed Care – PPO | Admitting: Physician Assistant

## 2023-05-08 ENCOUNTER — Inpatient Hospital Stay: Payer: BC Managed Care – PPO | Admitting: Physician Assistant

## 2023-05-08 ENCOUNTER — Inpatient Hospital Stay: Payer: BC Managed Care – PPO | Attending: Physician Assistant

## 2024-01-30 ENCOUNTER — Emergency Department (HOSPITAL_COMMUNITY)

## 2024-01-30 ENCOUNTER — Encounter (HOSPITAL_COMMUNITY): Payer: Self-pay

## 2024-01-30 ENCOUNTER — Emergency Department (HOSPITAL_COMMUNITY)
Admission: EM | Admit: 2024-01-30 | Discharge: 2024-01-30 | Discharge status: Against Medical Advice | Attending: Emergency Medicine | Admitting: Emergency Medicine

## 2024-01-30 ENCOUNTER — Other Ambulatory Visit: Payer: Self-pay

## 2024-01-30 DIAGNOSIS — R079 Chest pain, unspecified: Secondary | ICD-10-CM | POA: Diagnosis present

## 2024-01-30 DIAGNOSIS — Z5321 Procedure and treatment not carried out due to patient leaving prior to being seen by health care provider: Secondary | ICD-10-CM | POA: Insufficient documentation

## 2024-01-30 DIAGNOSIS — Y900 Blood alcohol level of less than 20 mg/100 ml: Secondary | ICD-10-CM | POA: Insufficient documentation

## 2024-01-30 DIAGNOSIS — R002 Palpitations: Secondary | ICD-10-CM | POA: Diagnosis not present

## 2024-01-30 DIAGNOSIS — F1310 Sedative, hypnotic or anxiolytic abuse, uncomplicated: Secondary | ICD-10-CM | POA: Insufficient documentation

## 2024-01-30 DIAGNOSIS — R110 Nausea: Secondary | ICD-10-CM | POA: Diagnosis not present

## 2024-01-30 DIAGNOSIS — T50901A Poisoning by unspecified drugs, medicaments and biological substances, accidental (unintentional), initial encounter: Secondary | ICD-10-CM | POA: Insufficient documentation

## 2024-01-30 DIAGNOSIS — F1210 Cannabis abuse, uncomplicated: Secondary | ICD-10-CM | POA: Insufficient documentation

## 2024-01-30 LAB — CBC WITH DIFFERENTIAL/PLATELET
Abs Immature Granulocytes: 0.06 10*3/uL (ref 0.00–0.07)
Basophils Absolute: 0 10*3/uL (ref 0.0–0.1)
Basophils Relative: 0 %
Eosinophils Absolute: 0.1 10*3/uL (ref 0.0–0.5)
Eosinophils Relative: 0 %
HCT: 33.6 % — ABNORMAL LOW (ref 36.0–46.0)
Hemoglobin: 10.4 g/dL — ABNORMAL LOW (ref 12.0–15.0)
Immature Granulocytes: 0 %
Lymphocytes Relative: 10 %
Lymphs Abs: 1.4 10*3/uL (ref 0.7–4.0)
MCH: 26.6 pg (ref 26.0–34.0)
MCHC: 31 g/dL (ref 30.0–36.0)
MCV: 85.9 fL (ref 80.0–100.0)
Monocytes Absolute: 0.7 10*3/uL (ref 0.1–1.0)
Monocytes Relative: 5 %
Neutro Abs: 12 10*3/uL — ABNORMAL HIGH (ref 1.7–7.7)
Neutrophils Relative %: 85 %
Platelets: 323 10*3/uL (ref 150–400)
RBC: 3.91 MIL/uL (ref 3.87–5.11)
RDW: 15.5 % (ref 11.5–15.5)
WBC: 14.3 10*3/uL — ABNORMAL HIGH (ref 4.0–10.5)
nRBC: 0 % (ref 0.0–0.2)

## 2024-01-30 LAB — COMPREHENSIVE METABOLIC PANEL WITH GFR
ALT: 16 U/L (ref 0–44)
AST: 21 U/L (ref 15–41)
Albumin: 3.7 g/dL (ref 3.5–5.0)
Alkaline Phosphatase: 53 U/L (ref 38–126)
Anion gap: 9 (ref 5–15)
BUN: 13 mg/dL (ref 6–20)
CO2: 23 mmol/L (ref 22–32)
Calcium: 8.2 mg/dL — ABNORMAL LOW (ref 8.9–10.3)
Chloride: 106 mmol/L (ref 98–111)
Creatinine, Ser: 0.79 mg/dL (ref 0.44–1.00)
GFR, Estimated: >60 mL/min (ref >60)
Glucose, Bld: 137 mg/dL — ABNORMAL HIGH (ref 70–99)
Potassium: 3.5 mmol/L (ref 3.5–5.1)
Sodium: 138 mmol/L (ref 135–145)
Total Bilirubin: 0.3 mg/dL (ref 0.0–1.2)
Total Protein: 6.9 g/dL (ref 6.5–8.1)

## 2024-01-30 LAB — RAPID URINE DRUG SCREEN, HOSP PERFORMED
Amphetamines: NOT DETECTED
Barbiturates: NOT DETECTED
Benzodiazepines: POSITIVE — AB
Cocaine: NOT DETECTED
Opiates: NOT DETECTED
Tetrahydrocannabinol: POSITIVE — AB

## 2024-01-30 LAB — TROPONIN I (HIGH SENSITIVITY): Troponin I (High Sensitivity): 3 ng/L (ref <18)

## 2024-01-30 LAB — ETHANOL: Alcohol, Ethyl (B): <15 mg/dL (ref <15)

## 2024-01-30 NOTE — ED Notes (Signed)
Pt stated she was leaving AMA due to wait 

## 2024-01-30 NOTE — ED Provider Triage Note (Signed)
 Emergency Medicine Provider Triage Evaluation Note  SHANEKA EFAW , a 43 y.o. female  was evaluated in triage.  Pt complains of palpitations and sensation of panic.  Had a delta 9 gummy her husband gave her.  Hx of similar reaction in the past with this.  Now complains of chest pain and nausea.  No prior cardiac hx.  Review of Systems  Positive: Chest pain, palpitations Negative: vomiting  Physical Exam  BP 137/88 (BP Location: Right Arm)   Pulse (!) 111   Temp (!) 97.3 F (36.3 C) (Oral)   Resp 18   Ht 5\' 1"  (1.549 m)   Wt 65.3 kg   LMP 01/29/2024 (Exact Date)   SpO2 100%   BMI 27.21 kg/m  Gen:   Awake, no distress   Resp:  Normal effort  MSK:   Moves extremities without difficulty  Other:  HR 110 during exam, NAD  Medical Decision Making  Medically screening exam initiated at 3:12 AM.  Appropriate orders placed.  Allix ROSALI AUGELLO was informed that the remainder of the evaluation will be completed by another provider, this initial triage assessment does not replace that evaluation, and the importance of remaining in the ED until their evaluation is complete.  Chest pain, palpitations after ingesting delta 9.  Similar reaction in the past.  EKG, labs, CXR.   Coretha Dew, PA-C 01/30/24 [phone]

## 2024-01-30 NOTE — ED Triage Notes (Signed)
 Pt arrived from home via [facility] s/p panic attack after ingesting one half of a Delta 9 gummy. EMS adm 2.5 mg versed  iv enroute approx 0220.

## 2024-04-07 ENCOUNTER — Emergency Department (HOSPITAL_COMMUNITY)

## 2024-04-07 ENCOUNTER — Emergency Department (HOSPITAL_COMMUNITY)
Admission: EM | Admit: 2024-04-07 | Discharge: 2024-04-07 | Discharge status: Against Medical Advice | Attending: Emergency Medicine | Admitting: Emergency Medicine

## 2024-04-07 ENCOUNTER — Other Ambulatory Visit: Payer: Self-pay

## 2024-04-07 ENCOUNTER — Encounter (HOSPITAL_COMMUNITY): Payer: Self-pay | Admitting: Emergency Medicine

## 2024-04-07 DIAGNOSIS — M542 Cervicalgia: Secondary | ICD-10-CM | POA: Insufficient documentation

## 2024-04-07 DIAGNOSIS — R110 Nausea: Secondary | ICD-10-CM | POA: Diagnosis not present

## 2024-04-07 DIAGNOSIS — Z5321 Procedure and treatment not carried out due to patient leaving prior to being seen by health care provider: Secondary | ICD-10-CM | POA: Insufficient documentation

## 2024-04-07 DIAGNOSIS — R519 Headache, unspecified: Secondary | ICD-10-CM | POA: Insufficient documentation

## 2024-04-07 LAB — BASIC METABOLIC PANEL WITH GFR
Anion gap: 7 (ref 5–15)
BUN: 10 mg/dL (ref 6–20)
CO2: 26 mmol/L (ref 22–32)
Calcium: 8.7 mg/dL — ABNORMAL LOW (ref 8.9–10.3)
Chloride: 109 mmol/L (ref 98–111)
Creatinine, Ser: 0.69 mg/dL (ref 0.44–1.00)
GFR, Estimated: >60 mL/min (ref >60)
Glucose, Bld: 87 mg/dL (ref 70–99)
Potassium: 3.8 mmol/L (ref 3.5–5.1)
Sodium: 142 mmol/L (ref 135–145)

## 2024-04-07 LAB — CBC
HCT: 32.2 % — ABNORMAL LOW (ref 36.0–46.0)
Hemoglobin: 10 g/dL — ABNORMAL LOW (ref 12.0–15.0)
MCH: 27.2 pg (ref 26.0–34.0)
MCHC: 31.1 g/dL (ref 30.0–36.0)
MCV: 87.7 fL (ref 80.0–100.0)
Platelets: 266 K/uL (ref 150–400)
RBC: 3.67 MIL/uL — ABNORMAL LOW (ref 3.87–5.11)
RDW: 15.6 % — ABNORMAL HIGH (ref 11.5–15.5)
WBC: 7.1 K/uL (ref 4.0–10.5)
nRBC: 0 % (ref 0.0–0.2)

## 2024-04-07 LAB — APTT: aPTT: 30 s (ref 24–36)

## 2024-04-07 LAB — PROTIME-INR
INR: 1 (ref 0.8–1.2)
Prothrombin Time: 13.7 s (ref 11.4–15.2)

## 2024-04-07 MED ORDER — ONDANSETRON 4 MG PO TBDP
4.0000 mg | ORAL_TABLET | Freq: Once | ORAL | Status: AC
  Administered 2024-04-07: 4 mg via ORAL
  Filled 2024-04-07: qty 1
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
## 2024-04-07 NOTE — ED Notes (Signed)
 Pt not answering to multiple calls for registration

## 2024-04-07 NOTE — ED Triage Notes (Signed)
 Pt sent from PCP for headache, neck pain, and ear pain x5 days. No fever reported.
